# Patient Record
Sex: Male | Born: 1965 | Race: White | Hispanic: No | Marital: Married | Smoking: Former smoker
Health system: Southern US, Community
[De-identification: ages and names within clinical notes are randomized; demographics above are authoritative.]

## PROBLEM LIST (undated history)

## (undated) DIAGNOSIS — Z8489 Family history of other specified conditions: Secondary | ICD-10-CM

## (undated) DIAGNOSIS — I499 Cardiac arrhythmia, unspecified: Secondary | ICD-10-CM

## (undated) DIAGNOSIS — I480 Paroxysmal atrial fibrillation: Secondary | ICD-10-CM

## (undated) DIAGNOSIS — N411 Chronic prostatitis: Secondary | ICD-10-CM

## (undated) HISTORY — DX: Paroxysmal atrial fibrillation: I48.0

## (undated) HISTORY — PX: IRRIGATION AND DEBRIDEMENT SEBACEOUS CYST: SHX5255

## (undated) HISTORY — DX: Chronic prostatitis: N41.1

---

## 2014-04-28 DIAGNOSIS — I499 Cardiac arrhythmia, unspecified: Secondary | ICD-10-CM

## 2014-04-28 HISTORY — DX: Cardiac arrhythmia, unspecified: I49.9

## 2015-04-14 ENCOUNTER — Emergency Department (HOSPITAL_COMMUNITY): Payer: 59

## 2015-04-14 ENCOUNTER — Observation Stay (HOSPITAL_COMMUNITY)
Admission: EM | Admit: 2015-04-14 | Discharge: 2015-04-15 | Disposition: A | Discharge status: Home / Self Care | Payer: 59 | Attending: Cardiology | Admitting: Cardiology

## 2015-04-14 ENCOUNTER — Encounter (HOSPITAL_COMMUNITY): Payer: Self-pay | Admitting: Emergency Medicine

## 2015-04-14 DIAGNOSIS — H6690 Otitis media, unspecified, unspecified ear: Secondary | ICD-10-CM | POA: Insufficient documentation

## 2015-04-14 DIAGNOSIS — Z7982 Long term (current) use of aspirin: Secondary | ICD-10-CM | POA: Insufficient documentation

## 2015-04-14 DIAGNOSIS — I4891 Unspecified atrial fibrillation: Secondary | ICD-10-CM | POA: Diagnosis not present

## 2015-04-14 DIAGNOSIS — I480 Paroxysmal atrial fibrillation: Secondary | ICD-10-CM

## 2015-04-14 HISTORY — DX: Paroxysmal atrial fibrillation: I48.0

## 2015-04-14 LAB — BASIC METABOLIC PANEL
ANION GAP: 9 (ref 5–15)
BUN: 12 mg/dL (ref 6–20)
CHLORIDE: 105 mmol/L (ref 101–111)
CO2: 26 mmol/L (ref 22–32)
CREATININE: 1.05 mg/dL (ref 0.61–1.24)
Calcium: 9.3 mg/dL (ref 8.9–10.3)
GFR calc non Af Amer: >60 mL/min (ref >60)
Glucose, Bld: 103 mg/dL — ABNORMAL HIGH (ref 65–99)
Potassium: 3.9 mmol/L (ref 3.5–5.1)
Sodium: 140 mmol/L (ref 135–145)

## 2015-04-14 LAB — MAGNESIUM: MAGNESIUM: 1.9 mg/dL (ref 1.7–2.4)

## 2015-04-14 LAB — CBC
HCT: 42.4 % (ref 39.0–52.0)
HEMOGLOBIN: 14.5 g/dL (ref 13.0–17.0)
MCH: 29.4 pg (ref 26.0–34.0)
MCHC: 34.2 g/dL (ref 30.0–36.0)
MCV: 86 fL (ref 78.0–100.0)
Platelets: 243 10*3/uL (ref 150–400)
RBC: 4.93 MIL/uL (ref 4.22–5.81)
RDW: 13.1 % (ref 11.5–15.5)
WBC: 6.8 10*3/uL (ref 4.0–10.5)

## 2015-04-14 LAB — TSH: TSH: 3.03 u[IU]/mL (ref 0.350–4.500)

## 2015-04-14 LAB — I-STAT TROPONIN, ED: Troponin i, poc: 0 ng/mL (ref 0.00–0.08)

## 2015-04-14 LAB — TROPONIN I: Troponin I: <0.03 ng/mL (ref <0.031)

## 2015-04-14 MED ORDER — DILTIAZEM HCL 100 MG IV SOLR
5.0000 mg/h | INTRAVENOUS | Status: DC
  Administered 2015-04-14: 5 mg/h via INTRAVENOUS
  Administered 2015-04-15: 10 mg/h via INTRAVENOUS
  Filled 2015-04-14 (×2): qty 100

## 2015-04-14 MED ORDER — DILTIAZEM LOAD VIA INFUSION
20.0000 mg | Freq: Once | INTRAVENOUS | Status: AC
  Administered 2015-04-14: 20 mg via INTRAVENOUS
  Filled 2015-04-14: qty 20

## 2015-04-14 NOTE — ED Notes (Addendum)
Pt sts palpitations x 1 hour; pt denies hx of same; pt denies CP or SOB; pt currently on mucinex, claritin and flonase

## 2015-04-14 NOTE — ED Notes (Signed)
Patient transported to X-ray 

## 2015-04-14 NOTE — ED Provider Notes (Signed)
CSN: [id number]     Arrival date & time 04/14/15  1849 History   First MD Initiated Contact with Patient 04/14/15 1901     Chief Complaint  Patient presents with  . Palpitations     (Consider location/radiation/quality/duration/timing/severity/associated sxs/prior Treatment) HPI Patient presents with concern of palpitations, generalized discomfort. Onset was about one hour ago, the patient notes recent generalized illness. Patient has been taking Mucinex, Claritin, inhaled steroids for ongoing URI like illness, with bilateral ear fullness, worse on the right. Otherwise, no recent medication changes. Today, about one hour ago, the patient felt the sudden onset of irregular heart palpitations. Mild lightheadedness, but no syncope, true chest pain, nausea, vomiting. Patient denies any subsequent medical problems, including no cardiac disease, history of arrhythmia. Patient does have multiple family members with dysrhythmia.  History reviewed. No pertinent past medical history. History reviewed. No pertinent past surgical history. History reviewed. No pertinent family history. Social History  Substance Use Topics  . Smoking status: Never Smoker   . Smokeless tobacco: None  . Alcohol Use: No    Review of Systems  Constitutional:       Per HPI, otherwise negative  HENT:       Per HPI, otherwise negative  Respiratory:       Per HPI, otherwise negative  Cardiovascular:       Per HPI, otherwise negative  Gastrointestinal: Negative for vomiting.  Endocrine:       Negative aside from HPI  Genitourinary:       Neg aside from HPI   Musculoskeletal:       Per HPI, otherwise negative  Skin: Negative.   Neurological: Negative for syncope.      Allergies  Review of patient's allergies indicates no known allergies.  Home Medications   Prior to Admission medications   Medication Sig Start Date End Date Taking? Authorizing Provider  fluticasone (FLONASE) 50 MCG/ACT nasal spray  Place 1 spray into both nostrils daily as needed (cold symptoms).  04/13/15  Yes Historical Provider, MD  GARLIC PO Take 1 capsule by mouth daily.   Yes Historical Provider, MD  GuaiFENesin (MUCINEX PO) Take 1 tablet by mouth daily as needed (cold symptoms).   Yes Historical Provider, MD  ibuprofen (ADVIL,MOTRIN) 200 MG tablet Take 200 mg by mouth daily as needed for headache.   Yes Historical Provider, MD  loratadine (CLARITIN) 10 MG tablet Take 10 mg by mouth daily as needed (cold symptoms).   Yes Historical Provider, MD  Misc Natural Products (TURMERIC CURCUMIN) CAPS Take 1 capsule by mouth daily.   Yes Historical Provider, MD  Multiple Vitamin (MULTIVITAMIN WITH MINERALS) TABS tablet Take 1 tablet by mouth daily.   Yes Historical Provider, MD  OVER THE COUNTER MEDICATION Take 1 capsule by mouth daily. Prostrate support supplement with stinging nettle and cranberrry   Yes Historical Provider, MD   BP 117/83 mmHg  Pulse 79  Temp(Src) 97.8 F (36.6 C) (Oral)  Resp 15  SpO2 97% Physical Exam  Constitutional: He is oriented to person, place, and time. He appears well-developed. No distress.  HENT:  Head: Normocephalic and atraumatic.  Eyes: Conjunctivae and EOM are normal.  Cardiovascular: An irregularly irregular rhythm present. Tachycardia present.   Pulmonary/Chest: Effort normal. No stridor. No respiratory distress.  Abdominal: He exhibits no distension.  Musculoskeletal: He exhibits no edema.  Neurological: He is alert and oriented to person, place, and time.  Skin: Skin is warm and dry.  Psychiatric: He has a normal mood  and affect.  Nursing note and vitals reviewed.   ED Course  Procedures (including critical care time) Labs Review Labs Reviewed  BASIC METABOLIC PANEL - Abnormal; Notable for the following:    Glucose, Bld 103 (*)    All other components within normal limits  CBC  TROPONIN I  MAGNESIUM  TSH  I-STAT TROPOININ, ED    Imaging Review Dg Chest 2  View  04/14/2015  CLINICAL DATA:  Chest pain, arrhythmia. EXAM: CHEST  2 VIEW COMPARISON:  None. FINDINGS: Heart size is normal. Overall cardiomediastinal silhouette is normal in size and configuration. Lungs are clear. Lung volumes are normal. No pleural effusion. No pneumothorax seen. There is slight elevation the right hemidiaphragm. Osseous and soft tissue structures about the chest are otherwise unremarkable. IMPRESSION: Lungs are clear and there is no evidence of acute cardiopulmonary abnormality. Electronically Signed   By: Bary Richard M.D.   On: 04/14/2015 19:58   I have personally reviewed and evaluated these images and lab results as part of my medical decision-making.   EKG Interpretation   Date/Time:  Saturday April 14 2015 18:53:34 EST Ventricular Rate:  131 PR Interval:    QRS Duration: 80 QT Interval:  294 QTC Calculation: 434 R Axis:   90 Text Interpretation:  Atrial fibrillation with rapid ventricular response  Rightward axis Nonspecific ST abnormality Abnormal QRS-T angle, consider  primary T wave abnormality Abnormal ECG Atrial fibrillation with rapid  ventricular response Abnormal ekg Confirmed by Gerhard Munch  MD [phone])  on 04/14/2015 7:01:53 PM     On cardiac monitor the patient has a rate in the 120/130 range, irregular, abnormal Pulse oximetry 95% with room air abnormal  Initial evaluation the patient was started on a Cardizem drip.    8:29 PM HR~105   10:17 PM HR remains 80-110.  I discussed the patient's case with cardiology. Given the persistent tachycardia, new atrial relation, patient will be admitted.  MDM  Previously well male presents with new palpitations. Patient sent have atrial fibrillation with rapid ventricular response. Patient has no history of coronary, normal cardiac disease. No evidence for ongoing coronary ischemia, no evidence for concurrent systemic infection. Patient did have decreased heart rate on Cardizem, but had  persistent arrhythmia. Patient required admission for further evaluation and management.  CRITICAL CARE Performed by: Gerhard Munch Total critical care time: 35 minutes Critical care time was exclusive of separately billable procedures and treating other patients. Critical care was necessary to treat or prevent imminent or life-threatening deterioration. Critical care was time spent personally by me on the following activities: development of treatment plan with patient and/or surrogate as well as nursing, discussions with consultants, evaluation of patient's response to treatment, examination of patient, obtaining history from patient or surrogate, ordering and performing treatments and interventions, ordering and review of laboratory studies, ordering and review of radiographic studies, pulse oximetry and re-evaluation of patient's condition.   Gerhard Munch, MD 04/14/15 2218

## 2015-04-14 NOTE — H&P (Signed)
History & Physical    Patient ID: Nathan Atkins MRN: [id number], DOB/AGE: 1965-09-03   Admit date: 04/14/2015   Primary Physician: No primary care provider on file. Primary Cardiologist: None   Patient Profile    73M otherwise healthy with AF-RVR  Past Medical History    History reviewed. No pertinent past medical history.  History reviewed. No pertinent past surgical history.   Allergies  No Known Allergies  History of Present Illness    The patient is a 73M with no significant PMH who presents today with rapid irregular palpitations. He notes that he has been suffering from a cold for the past few months but was recently prescribed nasal Flonase, claritin, and Mucinex. Around 6pm today he described an irregular heartbeat and took his BP using a wrist monitor. It alerted him of an irregular heart rhythm so he presented to the ED. In the ED, telemetry revealed rapid AF. He was given a diltiazem bolus followed by infusion which controlled his HR.   Of note, the patient previously drank about 5 cups of coffee daily, though now he has cut back to 1 cup coffee and 3-4 cups of green or black tea. He does not use energy drinks or other stimulants. No history of HTN or OSA. No history of thyroid trouble. 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 Routinely gets about 5 hrs/night of sleep.   Home Medications    Prior to Admission medications   Medication Sig Start Date End Date Taking? Authorizing Provider  fluticasone (FLONASE) 50 MCG/ACT nasal spray Place 1 spray into both nostrils daily as needed (cold symptoms).  04/13/15  Yes Historical Provider, MD  GARLIC PO Take 1 capsule by mouth daily.   Yes Historical Provider, MD  GuaiFENesin (MUCINEX PO) Take 1 tablet by mouth daily as needed (cold symptoms).   Yes Historical Provider, MD  ibuprofen (ADVIL,MOTRIN) 200 MG tablet Take 200 mg by mouth daily as needed for headache.   Yes  Historical Provider, MD  loratadine (CLARITIN) 10 MG tablet Take 10 mg by mouth daily as needed (cold symptoms).   Yes Historical Provider, MD  Misc Natural Products (TURMERIC CURCUMIN) CAPS Take 1 capsule by mouth daily.   Yes Historical Provider, MD  Multiple Vitamin (MULTIVITAMIN WITH MINERALS) TABS tablet Take 1 tablet by mouth daily.   Yes Historical Provider, MD  OVER THE COUNTER MEDICATION Take 1 capsule by mouth daily. Prostrate support supplement with stinging nettle and cranberrry   Yes Historical Provider, MD    Family History    History reviewed. No pertinent family history.  Social History    Social History   Social History  . Marital Status: Married    Spouse Name: N/A  . Number of Children: N/A  . Years of Education: N/A   Occupational History  . Not on file.   Social History Main Topics  . Smoking status: Never Smoker   . Smokeless tobacco: Not on file  . Alcohol Use: No  . Drug Use: No  . Sexual Activity: Not on file   Other Topics Concern  . Not on file   Social History Narrative  . No narrative on file     Review of Systems    General:  No chills, fever, night sweats or weight changes.  Cardiovascular:  No chest pain, dyspnea on exertion, edema, orthopnea, palpitations, paroxysmal nocturnal dyspnea. HEENT: Ear fullness Dermatological: No rash, lesions/masses Respiratory: No cough,  dyspnea Urologic: No hematuria, dysuria Abdominal:   No nausea, vomiting, diarrhea, bright red blood per rectum, melena, or hematemesis Neurologic:  No visual changes, wkns, changes in mental status. All other systems reviewed and are otherwise negative except as noted above.  Physical Exam    Blood pressure 125/81, pulse 93, temperature 97.8 F (36.6 C), temperature source Oral, resp. rate 12, SpO2 96 %.  General: Pleasant, NAD Psych: Normal affect. Neuro: Alert and oriented X 3. Moves all extremities spontaneously. HEENT: conjunctival injection  Neck: Supple  without bruits or JVD. Lungs:  Resp regular and unlabored, CTA. Heart: IRIR no s3, s4, or murmurs. Abdomen: Soft, non-tender, non-distended, BS + x 4.  Extremities: No clubbing, cyanosis or edema. DP/PT/Radials 2+ and equal bilaterally.  Labs    Troponin [facility](Point of Care Test)  Recent Labs  04/14/15 1948  TROPIPOC 0.00    Recent Labs  04/14/15 1928  TROPONINI <0.03   Lab Results  Component Value Date   WBC 6.8 04/14/2015   HGB 14.5 04/14/2015   HCT 42.4 04/14/2015   MCV 86.0 04/14/2015   PLT 243 04/14/2015    Recent Labs Lab 04/14/15 1928  NA 140  K 3.9  CL 105  CO2 26  BUN 12  CREATININE 1.05  CALCIUM 9.3  GLUCOSE 103*    Radiology Studies    Dg Chest 2 View  04/14/2015  CLINICAL DATA:  Chest pain, arrhythmia. EXAM: CHEST  2 VIEW COMPARISON:  None. FINDINGS: Heart size is normal. Overall cardiomediastinal silhouette is normal in size and configuration. Lungs are clear. Lung volumes are normal. No pleural effusion. No pneumothorax seen. There is slight elevation the right hemidiaphragm. Osseous and soft tissue structures about the chest are otherwise unremarkable. IMPRESSION: Lungs are clear and there is no evidence of acute cardiopulmonary abnormality. Electronically Signed   By: Bary RichardStan  Maynard M.D.   On: 04/14/2015 19:58    ECG & Cardiac Imaging    131bpm, AF, no acute ST-TW changes. No prior EKG for comparison  Assessment & Plan    1. Rapid atrial fibrillation  The patient is a 48M with palpitations beginning around 6pm tonight, though he has been feeling generally unwell for the past 2 months with URI symptoms. EKG revealed AF-RVR. There is no evidence of heart failure on examination. His HR in the 90s-110s on diltiazem infusion currently. We had a long discussion about AF risk factors and management strategies, including rate versus rhythm control. He has no clear predisposing or lifestyle factors, though he has multiple stressors and also a high caffeine  intake. I counseled him to work on both of those. At this point we will continue the diltiazem overnight and assess whether he spontaneously converts to NSR. If not, we had some discussions about DCCV on Monday, though I would probably prefer that he undergo TEE given that he has felt so poorly for a long I cannot say definitively that his AF started at 6pm and is <48hrs. He will only need anticoagulation if a rhythm control strategy is preferred; otherwise, he is CHADS-VASc = 0  -continue diltiazem infusion, plan for oral conversion in AM -TTE in AM to evaluate for structural heart disease -TSH ordered -counseled on diet and lifestyle changes -no anticoagulation ordered but if rhythm control strategy then would consider starting OAC directly with rivaroxaban or apixaban tomorrow. He does not have indication for long-term therapy at this time but will need for 4 wks if rhythm control pursued.   FULL CODE  Signed, Sammuel Hines, MD MPH 04/14/2015, 10:28 PM

## 2015-04-15 ENCOUNTER — Encounter (HOSPITAL_COMMUNITY): Payer: Self-pay | Admitting: *Deleted

## 2015-04-15 ENCOUNTER — Observation Stay (HOSPITAL_BASED_OUTPATIENT_CLINIC_OR_DEPARTMENT_OTHER): Payer: 59

## 2015-04-15 DIAGNOSIS — H6692 Otitis media, unspecified, left ear: Secondary | ICD-10-CM

## 2015-04-15 DIAGNOSIS — H6690 Otitis media, unspecified, unspecified ear: Secondary | ICD-10-CM | POA: Diagnosis not present

## 2015-04-15 DIAGNOSIS — I4891 Unspecified atrial fibrillation: Principal | ICD-10-CM

## 2015-04-15 DIAGNOSIS — Z7189 Other specified counseling: Secondary | ICD-10-CM

## 2015-04-15 DIAGNOSIS — Z7982 Long term (current) use of aspirin: Secondary | ICD-10-CM | POA: Diagnosis not present

## 2015-04-15 LAB — BASIC METABOLIC PANEL
Anion gap: 10 (ref 5–15)
BUN: 10 mg/dL (ref 6–20)
CO2: 25 mmol/L (ref 22–32)
CREATININE: 0.99 mg/dL (ref 0.61–1.24)
Calcium: 8.8 mg/dL — ABNORMAL LOW (ref 8.9–10.3)
Chloride: 105 mmol/L (ref 101–111)
GFR calc Af Amer: >60 mL/min (ref >60)
GLUCOSE: 97 mg/dL (ref 65–99)
Potassium: 3.9 mmol/L (ref 3.5–5.1)
SODIUM: 140 mmol/L (ref 135–145)

## 2015-04-15 LAB — CBC
HCT: 41.2 % (ref 39.0–52.0)
Hemoglobin: 14.1 g/dL (ref 13.0–17.0)
MCH: 29.3 pg (ref 26.0–34.0)
MCHC: 34.2 g/dL (ref 30.0–36.0)
MCV: 85.7 fL (ref 78.0–100.0)
Platelets: 230 10*3/uL (ref 150–400)
RBC: 4.81 MIL/uL (ref 4.22–5.81)
RDW: 13.3 % (ref 11.5–15.5)
WBC: 6.7 10*3/uL (ref 4.0–10.5)

## 2015-04-15 MED ORDER — DILTIAZEM HCL 30 MG PO TABS: 30.0000 mg | ORAL_TABLET | Freq: Two times a day (BID) | ORAL | Status: DC

## 2015-04-15 MED ORDER — ONDANSETRON HCL 4 MG/2ML IJ SOLN: 4.0000 mg | Freq: Four times a day (QID) | INTRAMUSCULAR | Status: DC | PRN

## 2015-04-15 MED ORDER — ASPIRIN EC 325 MG PO TBEC
325.0000 mg | DELAYED_RELEASE_TABLET | Freq: Every day | ORAL | Status: DC
Start: 2015-04-15 — End: 2015-04-15
  Administered 2015-04-15: 325 mg via ORAL
  Filled 2015-04-15: qty 1

## 2015-04-15 MED ORDER — SULFAMETHOXAZOLE-TRIMETHOPRIM 800-160 MG PO TABS: 1.0000 | ORAL_TABLET | Freq: Two times a day (BID) | ORAL | Status: DC

## 2015-04-15 MED ORDER — SULFAMETHOXAZOLE-TRIMETHOPRIM 800-160 MG PO TABS
1.0000 | ORAL_TABLET | Freq: Two times a day (BID) | ORAL | Status: DC
  Administered 2015-04-15: 1 via ORAL
  Filled 2015-04-15 (×2): qty 1

## 2015-04-15 MED ORDER — FLUTICASONE PROPIONATE 50 MCG/ACT NA SUSP
1.0000 | Freq: Every day | NASAL | Status: DC | PRN
  Filled 2015-04-15: qty 16

## 2015-04-15 MED ORDER — LORATADINE 10 MG PO TABS: 10.0000 mg | ORAL_TABLET | Freq: Every day | ORAL | Status: DC | PRN

## 2015-04-15 MED ORDER — ACETAMINOPHEN 325 MG PO TABS: 650.0000 mg | ORAL_TABLET | ORAL | Status: DC | PRN

## 2015-04-15 MED ORDER — DILTIAZEM HCL 60 MG PO TABS
60.0000 mg | ORAL_TABLET | Freq: Four times a day (QID) | ORAL | Status: DC
  Administered 2015-04-15 (×2): 60 mg via ORAL
  Filled 2015-04-15 (×2): qty 1

## 2015-04-15 MED ORDER — ASPIRIN 325 MG PO TBEC: 325.0000 mg | DELAYED_RELEASE_TABLET | Freq: Every day | ORAL | Status: DC

## 2015-04-15 NOTE — Progress Notes (Signed)
SUBJECTIVE: Patient is feeling very well. Has had inner ear infection for some time (2 months) and took Claritin, Mucinex, and Flonase yesterday (normally takes no meds). He wonders if the combination of meds with ear infection led to atrial fibrillation. Mother does have tachyarrhythmias.  Has reduced coffee intake to 1-2 cups daily, primarily drinks black and green tea. Exercises daily.  His son participates on my daughter's robotics team.  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  They have 5 children.     Intake/Output Summary (Last 24 hours) at 04/15/15 1248 Last data filed at 04/15/15 1011  Gross per 24 hour  Intake 4353.54 ml  Output    275 ml  Net 4078.54 ml    Current Facility-Administered Medications  Medication Dose Route Frequency Provider Last Rate Last Dose  . acetaminophen (TYLENOL) tablet 650 mg  650 mg Oral Q4H PRN Sammuel HinesAmit N Vora, MD      . diltiazem (CARDIZEM) 100 mg in dextrose 5 % 100 mL (1 mg/mL) infusion  5-15 mg/hr Intravenous Continuous Gerhard Munchobert Lockwood, MD   Stopped at 04/15/15 0505  . diltiazem (CARDIZEM) tablet 60 mg  60 mg Oral 4 times per day Sammuel HinesAmit N Vora, MD   60 mg at 04/15/15 1240  . fluticasone (FLONASE) 50 MCG/ACT nasal spray 1 spray  1 spray Each Nare Daily PRN Sammuel HinesAmit N Vora, MD      . loratadine (CLARITIN) tablet 10 mg  10 mg Oral Daily PRN Sammuel HinesAmit N Vora, MD      . ondansetron (ZOFRAN) injection 4 mg  4 mg Intravenous Q6H PRN Sammuel HinesAmit N Vora, MD        Filed Vitals:   04/14/15 2245 04/14/15 2325 04/15/15 0224 04/15/15 0540  BP: 113/75 109/60 105/61 103/62  Pulse: 98 89 72 60  Temp:  97.9 F (36.6 C) 97.8 F (36.6 C) 97.9 F (36.6 C)  TempSrc:  Oral Oral Oral  Resp: 13 18 19 18   Height:  5\' 11"  (1.803 m)    Weight:  218 lb 14.7 oz (99.3 kg)  219 lb 9.3 oz (99.6 kg)  SpO2: 96% 97% 97% 96%    PHYSICAL EXAM General: NAD HEENT: Normal. Neck: No JVD, no thyromegaly.    Lungs: Clear to auscultation bilaterally with normal respiratory effort. CV: Nondisplaced PMI.  Regular rate and rhythm, normal S1/S2, no S3/S4, no murmur.  No pretibial edema.  No carotid bruit.  Normal pedal pulses.  Abdomen: Soft, nontender, no hepatosplenomegaly, no distention.  Neurologic: Alert and oriented x 3.  Psych: Normal affect. Musculoskeletal: Normal range of motion. No gross deformities. Extremities: No clubbing or cyanosis.   TELEMETRY: Reviewed telemetry pt in sinus rhythm.  LABS: Basic Metabolic Panel:  Recent Labs  40/98/1112/17/16 1928 04/15/15 0323  NA 140 140  K 3.9 3.9  CL 105 105  CO2 26 25  GLUCOSE 103* 97  BUN 12 10  CREATININE 1.05 0.99  CALCIUM 9.3 8.8*  MG 1.9  --    Liver Function Tests: No results for input(s): AST, ALT, ALKPHOS, BILITOT, PROT, ALBUMIN in the last 72 hours. No results for input(s): LIPASE, AMYLASE in the last 72 hours. CBC:  Recent Labs  04/14/15 1928 04/15/15 0323  WBC 6.8 6.7  HGB 14.5 14.1  HCT 42.4 41.2  MCV 86.0 85.7  PLT 243 230   Cardiac Enzymes:  Recent Labs  04/14/15 1928  TROPONINI <0.03  BNP: Invalid input(s): POCBNP D-Dimer: No results for input(s): DDIMER in the last 72 hours. Hemoglobin A1C: No results for input(s): HGBA1C in the last 72 hours. Fasting Lipid Panel: No results for input(s): CHOL, HDL, LDLCALC, TRIG, CHOLHDL, LDLDIRECT in the last 72 hours. Thyroid Function Tests:  Recent Labs  04/14/15 1942  TSH 3.030   Anemia Panel: No results for input(s): VITAMINB12, FOLATE, FERRITIN, TIBC, IRON, RETICCTPCT in the last 72 hours.  RADIOLOGY: Dg Chest 2 View  04/14/2015  CLINICAL DATA:  Chest pain, arrhythmia. EXAM: CHEST  2 VIEW COMPARISON:  None. FINDINGS: Heart size is normal. Overall cardiomediastinal silhouette is normal in size and configuration. Lungs are clear. Lung volumes are normal. No pleural effusion. No pneumothorax seen. There is slight elevation the right hemidiaphragm.  Osseous and soft tissue structures about the chest are otherwise unremarkable. IMPRESSION: Lungs are clear and there is no evidence of acute cardiopulmonary abnormality. Electronically Signed   By: Bary Richard M.D.   On: 04/14/2015 19:58      ASSESSMENT AND PLAN: 1. New-onset atrial fibrillation: May have been precipitated by multiple new meds taken yesterday in context of ear infection. Mother does have tachyarrhythmias. TSH normal. Back in sinus rhythm and on oral diltiazem. No evidence of heart failure and CHA2DS2VASC score is 0.  Plan: Will give ASA 325 mg daily x one month. Will start low-dose diltiazem 30 mg bid possibly only for one month until ear infection resolves. Will give prescription for Bactrim. Recommended he hold off on exercising vigorously for the next week until infection starts to subside and especially while arrhythmic threshold is low. I have ordered echocardiogram to evaluate for structural heart disease.  He can be discharged today.  I will have him follow up with me if he is willing to drive to our [place] office. Otherwise, will have him f/u with Dr. Jens Som in our [facility] office.   Prentice Docker, M.D., F.A.C.C.

## 2015-04-15 NOTE — Discharge Summary (Signed)
Physician Discharge Summary  Patient ID: Nathan Atkins MRN: [id number] DOB/AGE: 47/27/1967 47 y.o.  Admit date: 04/14/2015 Discharge date: 04/15/2015  Primary Discharge Diagnosis 1. New onset atrial fibrillation: 2. Internal ear infection   Primary Cardiologist: Dr.,Koneswaran  Significant Diagnostic Studies: Echocardiogram  Hospital Course:     [name] is a 49 year old male patient with no significant cardiac history who presented to the emergency room with rapid irregular palpitations. He isn't suffering from a cold for the past 2 months and was recently prescribed Flonase and Claritin along with Mucinex. On arrival to the emergency room he was found to be in rapid atrial fibrillation. He was given diltiazem bolus followed by infusion which controlled his heart rate. The patient also admitted to being under multiple stressors and also having high caffeine intake.  He was also found to have an ear infection, and had not been placed on antibiotics until this admission. He was started on Bactrim which she will continue to take for the next 2 weeks. The patient was continued on low-dose diltiazem 30 mg twice a day, he was continue on aspirin 325 mg 1 month. Echocardiogram was ordered but not resulted at time of discharge . He converted to normal sinus rhythm prior to being started on by mouth diltiazem.He was not placed on anticoagulation as his CHADS VASC Score is 1.   He is to follow-up in [place] at his request as this is close to his home.   Discharge Exam: Blood pressure 123/81, pulse 65, temperature 98.2 F (36.8 C), temperature source Oral, resp. rate 20, height  (1.803 m), weight 219 lb 9.3 oz (99.6 kg), SpO2 96 %.    Labs:   Lab Results  Component Value Date   WBC 6.7 04/15/2015   HGB 14.1 04/15/2015   HCT 41.2 04/15/2015   MCV 85.7 04/15/2015   PLT 230 04/15/2015     Recent Labs Lab 04/15/15 0323  NA 140  K 3.9  CL 105  CO2 25  BUN 10  CREATININE  0.99  CALCIUM 8.8*  GLUCOSE 97   Lab Results  Component Value Date   TROPONINI <0.03 04/14/2015   No results found for: CHOL No results found for: HDL No results found for: LDLCALC No results found for: TRIG No results found for: CHOLHDL No results found for: LDLDIRECT    Radiology: Dg Chest 2 View  04/14/2015  CLINICAL DATA:  Chest pain, arrhythmia. EXAM: CHEST  2 VIEW COMPARISON:  None. FINDINGS: Heart size is normal. Overall cardiomediastinal silhouette is normal in size and configuration. Lungs are clear. Lung volumes are normal. No pleural effusion. No pneumothorax seen. There is slight elevation the right hemidiaphragm. Osseous and soft tissue structures about the chest are otherwise unremarkable. IMPRESSION: Lungs are clear and there is no evidence of acute cardiopulmonary abnormality. Electronically Signed   By: Bary Richard M.D.   On: 04/14/2015 19:58    FOLLOW UP PLANS AND APPOINTMENTS     Discharge Instructions    Amb referral to AF[facility]    Complete by:  As directed      Diet - low sodium heart healthy    Complete by:  As directed      Increase activity slowly    Complete by:  As directed             Medication List    TAKE these medications        aspirin 325 MG EC tablet  Take 1 tablet (  325 mg total) by mouth daily.     diltiazem 30 MG tablet  Commonly known as:  CARDIZEM  Take 1 tablet (30 mg total) by mouth every 12 (twelve) hours.     fluticasone 50 MCG/ACT nasal spray  Commonly known as:  FLONASE  Place 1 spray into both nostrils daily as needed (cold symptoms).     GARLIC PO  Take 1 capsule by mouth daily.     ibuprofen 200 MG tablet  Commonly known as:  ADVIL,MOTRIN  Take 200 mg by mouth daily as needed for headache.     loratadine 10 MG tablet  Commonly known as:  CLARITIN  Take 10 mg by mouth daily as needed (cold symptoms).     MUCINEX PO  Take 1 tablet by mouth daily as needed (cold symptoms).     multivitamin with minerals  Tabs tablet  Take 1 tablet by mouth daily.     OVER THE COUNTER MEDICATION  Take 1 capsule by mouth daily. Prostrate support supplement with stinging nettle and cranberrry     sulfamethoxazole-trimethoprim 800-160 MG tablet  Commonly known as:  BACTRIM DS,SEPTRA DS  Take 1 tablet by mouth every 12 (twelve) hours.     Turmeric Curcumin Caps  Take 1 capsule by mouth daily.       Follow-up Information    Follow up with Olga MillersBrian Crenshaw, MD.   Specialty:  Cardiology   Why:  Our office will call you for appointment   Contact information:   [address] [place] [id number] [phone]         Time spent with patient to include physician time: 30 minutes Signed: Bettey MareKathryn M. Nena Hampe NP AACC  04/15/2015, 3:11 PM Co-Sign MD

## 2015-04-15 NOTE — Discharge Instructions (Signed)
Aspirin and Your Heart  Aspirin is a medicine that affects the way blood clots. Aspirin can be used to help reduce the risk of blood clots, heart attacks, and other heart-related problems.  SHOULD I TAKE ASPIRIN? Your health care provider will help you determine whether it is safe and beneficial for you to take aspirin daily. Taking aspirin daily may be beneficial if you:  Have had a heart attack or chest pain.  Have undergone open heart surgery such as coronary artery bypass surgery (CABG).  Have had coronary angioplasty.  Have experienced a stroke or transient ischemic attack (TIA).  Have peripheral vascular disease (PVD).  Have chronic heart rhythm problems such as atrial fibrillation. ARE THERE ANY RISKS OF TAKING ASPIRIN DAILY? Daily use of aspirin can increase your risk of side effects. Some of these include:  Bleeding. Bleeding problems can be minor or serious. An example of a minor problem is a cut that does not stop bleeding. An example of a more serious problem is stomach bleeding or bleeding into the brain. Your risk of bleeding is increased if you are also taking non-steroidal anti-inflammatory medicine (NSAIDs).  Increased bruising.  Upset stomach.  An allergic reaction. People who have nasal polyps have an increased risk of developing an aspirin allergy. WHAT ARE SOME GUIDELINES I SHOULD FOLLOW WHEN TAKING ASPIRIN?   Take aspirin only as directed by your health care provider. Make sure you understand how much you should take and what form you should take. The two forms of aspirin are:  Non-enteric-coated. This type of aspirin does not have a coating and is absorbed quickly. Non-enteric-coated aspirin is usually recommended for people with chest pain. This type of aspirin also comes in a chewable form.  Enteric-coated. This type of aspirin has a special coating that releases the medicine very slowly. Enteric-coated aspirin causes less stomach upset than non-enteric-coated  aspirin. This type of aspirin should not be chewed or crushed.  Drink alcohol in moderation. Drinking alcohol increases your risk of bleeding. WHEN SHOULD I SEEK MEDICAL CARE?   You have unusual bleeding or bruising.  You have stomach pain.  You have an allergic reaction. Symptoms of an allergic reaction include:  Hives.  Itchy skin.  Swelling of the lips, tongue, or face.  You have ringing in your ears. WHEN SHOULD I SEEK IMMEDIATE MEDICAL CARE?   Your bowel movements are bloody, dark red, or black in color.  You vomit or cough up blood.  You have blood in your urine.  You cough, wheeze, or feel short of breath. If you have any of the following symptoms, this is an emergency. Do not wait to see if the pain will go away. Get medical help at once. Call your local emergency services (911 in the [place] Do not drive yourself to the hospital.  You have severe chest pain, especially if the pain is crushing or pressure-like and spreads to the arms, back, neck, or jaw.  You have stroke-like symptoms, such as:   Loss of vision.   Difficulty talking.   Numbness or weakness on one side of your body.   Numbness or weakness in your arm or leg.   Not thinking clearly or feeling confused.    This information is not intended to replace advice given to you by your health care provider. Make sure you discuss any questions you have with your health care provider.   Document Released: 03/27/2008 Document Revised: 05/05/2014 Document Reviewed: 07/20/2013 Elsevier Interactive Patient Education 2016 Elsevier  Inc.  Atrial Fibrillation Atrial fibrillation is a type of heartbeat that is irregular or fast (rapid). If you have this condition, your heart keeps quivering in a weird (chaotic) way. This condition can make it so your heart cannot pump blood normally. Having this condition gives a person more risk for stroke, heart failure, and other heart problems. There are different types  of atrial fibrillation. Talk with your doctor to learn about the type that you have. HOME CARE  Take over-the-counter and prescription medicines only as told by your doctor.  If your doctor prescribed a blood-thinning medicine, take it exactly as told. Taking too much of it can cause bleeding. If you do not take enough of it, you will not have the protection that you need against stroke and other problems.  Do not use any tobacco products. These include cigarettes, chewing tobacco, and e-cigarettes. If you need help quitting, ask your doctor.  If you have apnea (obstructive sleep apnea), manage it as told by your doctor.  Do not drink alcohol.  Do not drink beverages that have caffeine. These include coffee, soda, and tea.  Maintain a healthy weight. Do not use diet pills unless your doctor says they are safe for you. Diet pills may make heart problems worse.  Follow diet instructions as told by your doctor.  Exercise regularly as told by your doctor.  Keep all follow-up visits as told by your doctor. This is important. GET HELP IF:  You notice a change in the speed, rhythm, or strength of your heartbeat.  You are taking a blood-thinning medicine and you notice more bruising.  You get tired more easily when you move or exercise. GET HELP RIGHT AWAY IF:  You have pain in your chest or your belly (abdomen).  You have sweating or weakness.  You feel sick to your stomach (nauseous).  You notice blood in your throw up (vomit), poop (stool), or pee (urine).  You are short of breath.  You suddenly have swollen feet and ankles.  You feel dizzy.  Your suddenly get weak or numb in your face, arms, or legs, especially if it happens on one side of your body.  You have trouble talking, trouble understanding, or both.  Your face or your eyelid droops on one side. These symptoms may be an emergency. Do not wait to see if the symptoms will go away. Get medical help right away. Call  your local emergency services (911 in the [place] Do not drive yourself to the hospital.   This information is not intended to replace advice given to you by your health care provider. Make sure you discuss any questions you have with your health care provider.   Document Released: 01/22/2008 Document Revised: 01/03/2015 Document Reviewed: 08/09/2014 Elsevier Interactive Patient Education 2016 Elsevier Inc.  Atrial Fibrillation Atrial fibrillation is a type of irregular or rapid heartbeat (arrhythmia). In atrial fibrillation, the heart quivers continuously in a chaotic pattern. This occurs when parts of the heart receive disorganized signals that make the heart unable to pump blood normally. This can increase the risk for stroke, heart failure, and other heart-related conditions. There are different types of atrial fibrillation, including:  Paroxysmal atrial fibrillation. This type starts suddenly, and it usually stops on its own shortly after it starts.  Persistent atrial fibrillation. This type often lasts longer than a week. It may stop on its own or with treatment.  Long-lasting persistent atrial fibrillation. This type lasts longer than 12 months.  Permanent atrial  fibrillation. This type does not go away. Talk with your health care provider to learn about the type of atrial fibrillation that you have. CAUSES This condition is caused by some heart-related conditions or procedures, including:  A heart attack.  Coronary artery disease.  Heart failure.  Heart valve conditions.  High blood pressure.  Inflammation of the sac that surrounds the heart (pericarditis).  Heart surgery.  Certain heart rhythm disorders, such as Wolf-Parkinson-White syndrome. Other causes include:  Pneumonia.  Obstructive sleep apnea.  Blockage of an artery in the lungs (pulmonary embolism, or PE).  Lung cancer.  Chronic lung disease.  Thyroid problems, especially if the thyroid is overactive  (hyperthyroidism).  Caffeine.  Excessive alcohol use or illegal drug use.  Use of some medicines, including certain decongestants and diet pills. Sometimes, the cause cannot be found. RISK FACTORS This condition is more likely to develop in:  People who are older in age.  People who smoke.  People who have diabetes mellitus.  People who are overweight (obese).  Athletes who exercise vigorously. SYMPTOMS Symptoms of this condition include:  A feeling that your heart is beating rapidly or irregularly.  A feeling of discomfort or pain in your chest.  Shortness of breath.  Sudden light-headedness or weakness.  Getting tired easily during exercise. In some cases, there are no symptoms. DIAGNOSIS Your health care provider may be able to detect atrial fibrillation when taking your pulse. If detected, this condition may be diagnosed with:  An electrocardiogram (ECG).  A Holter monitor test that records your heartbeat patterns over a 24-hour period.  Transthoracic echocardiogram (TTE) to evaluate how blood flows through your heart.  Transesophageal echocardiogram (TEE) to view more detailed images of your heart.  A stress test.  Imaging tests, such as a CT scan or chest X-ray.  Blood tests. TREATMENT The main goals of treatment are to prevent blood clots from forming and to keep your heart beating at a normal rate and rhythm. The type of treatment that you receive depends on many factors, such as your underlying medical conditions and how you feel when you are experiencing atrial fibrillation. This condition may be treated with:  Medicine to slow down the heart rate, bring the heart's rhythm back to normal, or prevent clots from forming.  Electrical cardioversion. This is a procedure that resets your heart's rhythm by delivering a controlled, low-energy shock to the heart through your skin.  Different types of ablation, such as catheter ablation, catheter ablation with  pacemaker, or surgical ablation. These procedures destroy the heart tissues that send abnormal signals. When the pacemaker is used, it is placed under your skin to help your heart beat in a regular rhythm. HOME CARE INSTRUCTIONS  Take over-the counter and prescription medicines only as told by your health care provider.  If your health care provider prescribed a blood-thinning medicine (anticoagulant), take it exactly as told. Taking too much blood-thinning medicine can cause bleeding. If you do not take enough blood-thinning medicine, you will not have the protection that you need against stroke and other problems.  Do not use tobacco products, including cigarettes, chewing tobacco, and e-cigarettes. If you need help quitting, ask your health care provider.  If you have obstructive sleep apnea, manage your condition as told by your health care provider.  Do not drink alcohol.  Do not drink beverages that contain caffeine, such as coffee, soda, and tea.  Maintain a healthy weight. Do not use diet pills unless your health care provider  approves. Diet pills may make heart problems worse.  Follow diet instructions as told by your health care provider.  Exercise regularly as told by your health care provider.  Keep all follow-up visits as told by your health care provider. This is important. PREVENTION  Avoid drinking beverages that contain caffeine or alcohol.  Avoid certain medicines, especially medicines that are used for breathing problems.  Avoid certain herbs and herbal medicines, such as those that contain ephedra or ginseng.  Do not use illegal drugs, such as cocaine and amphetamines.  Do not smoke.  Manage your high blood pressure. SEEK MEDICAL CARE IF:  You notice a change in the rate, rhythm, or strength of your heartbeat.  You are taking an anticoagulant and you notice increased bruising.  You tire more easily when you exercise or exert yourself. SEEK IMMEDIATE  MEDICAL CARE IF:  You have chest pain, abdominal pain, sweating, or weakness.  You feel nauseous.  You notice blood in your vomit, bowel movement, or urine.  You have shortness of breath.  You suddenly have swollen feet and ankles.  You feel dizzy.  You have sudden weakness or numbness of the face, arm, or leg, especially on one side of the body.  You have trouble speaking, trouble understanding, or both (aphasia).  Your face or your eyelid droops on one side. These symptoms may represent a serious problem that is an emergency. Do not wait to see if the symptoms will go away. Get medical help right away. Call your local emergency services (911 in the [place] Do not drive yourself to the hospital.   This information is not intended to replace advice given to you by your health care provider. Make sure you discuss any questions you have with your health care provider.   Document Released: 04/14/2005 Document Revised: 01/03/2015 Document Reviewed: 08/09/2014 Elsevier Interactive Patient Education [employer]

## 2015-04-15 NOTE — Plan of Care (Signed)
Problem: Phase I Progression Outcomes Goal: Voiding-avoid urinary catheter unless indicated Outcome: Completed/Met Date Met:  04/15/15 Using urinal

## 2015-04-15 NOTE — Progress Notes (Signed)
Patient progressed well on Cardizem gtt and converted to Sinus approximately 0025 with HR 70's to 90's. Dr Ace GinsVora  texted  then called. Order received to administer Cardizem 60 mg po & keep gtt on for 30 minutes @ then current rate of 10 cc/hr. Cardizem was decreased to 5 mg/hr @ 0435 for another 30 minutes then to d/c. No deviation noted as this Clinical research associatewriter continues to monitor patient.

## 2015-04-15 NOTE — Progress Notes (Signed)
  Echocardiogram 2D Echocardiogram has been performed.  Nathan Atkins, Nathan Atkins 04/15/2015, 2:35 PM

## 2015-04-15 NOTE — Progress Notes (Signed)
Pt discharge education and instructions completed with pt; pt voices understanding and denies any questions. Pt IV and telemetry removed; pt discharge home with family to transport him home. Pt handed his prescription for aspirin and to pick up electronically sent prescriptions from preferred pharmacy on file. Pt transported off unit via wheelchair with belongings to the side. Arabella MerlesP. Amo Deetra Booton RN.

## 2015-05-01 ENCOUNTER — Encounter: Payer: Self-pay | Admitting: Physician Assistant

## 2015-05-01 ENCOUNTER — Ambulatory Visit (INDEPENDENT_AMBULATORY_CARE_PROVIDER_SITE_OTHER): Payer: 59 | Admitting: Physician Assistant

## 2015-05-01 VITALS — BP 128/76 | HR 70 | Ht 71.0 in | Wt 220.7 lb

## 2015-05-01 DIAGNOSIS — I4891 Unspecified atrial fibrillation: Secondary | ICD-10-CM

## 2015-05-01 DIAGNOSIS — R002 Palpitations: Secondary | ICD-10-CM | POA: Diagnosis not present

## 2015-05-01 DIAGNOSIS — I480 Paroxysmal atrial fibrillation: Secondary | ICD-10-CM

## 2015-05-01 MED ORDER — DILTIAZEM HCL 30 MG PO TABS: ORAL_TABLET | ORAL | Status: DC

## 2015-05-01 NOTE — Progress Notes (Signed)
Cardiology Office Note   Date:  05/01/2015   ID:  Nathan Atkins, DOB 09/23/1965, MRN [id number]  PCP:  No primary care provider on file.  Cardiologist:   Seen in hospital by Dr Koneswaran>> Dr. Verne Carrowandolph Shallen Luedke, [place]   Chief Complaint  Patient presents with  . Hospitalization Follow-up  . Atrial Fibrillation    History of Present Illness: Nathan Atkins is a 50 y.o. male with a history of recent admission for PAF, not anticoagulated as CHADS2VASC=1. He converted spontaneously to sinus rhythm.  Nathan Atkins presents for  Post hospital follow-up.   Nathan Atkins was symptomatic with the atrial fibrillation. He has not had those symptoms since he was discharged. He took the Cardizem after discharge as requested and discontinued it after was completed. He was a little tired and wonders if that was because of the Cardizem, but that is resolved. He has increased his activity somewhat and went for a long hike the other day without any difficulty. He has not had palpitations, shortness of breath, dyspnea on exertion or chest pain. He feels he is in his usual state of health, and feels well. He wonders if he needs to continue the aspirin.   Past Medical History  Diagnosis Date  . Paroxysmal atrial fibrillation with rapid ventricular response (HCC) 04/14/2015    Spontaneously converted to sinus rhythm after started on diltiazem  . Chronic prostatitis     Past Surgical History  Procedure Laterality Date  . Irrigation and debridement sebaceous cyst      Current Outpatient Prescriptions  Medication Sig Dispense Refill  . aspirin EC 325 MG EC tablet Take 1 tablet (325 mg total) by mouth daily. (Patient taking differently: Take 162 mg by mouth 2 (two) times daily. ) 30 tablet 0  . GARLIC PO Take 1 capsule by mouth daily.    . Multiple Vitamin (MULTIVITAMIN WITH MINERALS) TABS tablet Take 1 tablet by mouth daily.    Marland Kitchen. OVER THE COUNTER MEDICATION Take 1 capsule by mouth  daily. Prostrate support supplement with stinging nettle and cranberrry     No current facility-administered medications for this visit.    Allergies:   Review of patient's allergies indicates no known allergies.    Social History:  The patient  reports that he has never smoked. He does not have any smokeless tobacco history on file. He reports that he does not drink alcohol or use illicit drugs.   Family History:  The patient's family history includes Arrhythmia in his mother.    ROS:  Please see the history of present illness. All other systems are reviewed and negative.    PHYSICAL EXAM: VS:  BP 128/76 mmHg  Pulse 70  Ht 5\' 11"  (1.803 m)  Wt 220 lb 11.2 oz (100.109 kg)  BMI 30.80 kg/m2 , BMI Body mass index is 30.8 kg/(m^2). GEN: Well nourished, well developed, male in no acute distress HEENT: normal for age  Neck: no JVD, no carotid bruit, no masses Cardiac: RRR; no murmur, no rubs, or gallops Respiratory:  clear to auscultation bilaterally, normal work of breathing GI: soft, nontender, nondistended, + BS MS: no deformity or atrophy; no edema; distal pulses are 2+ in all 4 extremities  Skin: warm and dry, no rash Neuro:  Strength and sensation are intact Psych: euthymic mood, full affect   EKG:  EKG is ordered today. The ekg ordered today demonstrates  Sinus rhythm, rate 70 with normal intervals and no acute changes.   Echocardiogram :  04/15/2015 Conclusions  - Left ventricle: The cavity size was normal. Wall thickness was normal. Systolic function was vigorous. The estimated ejection fraction was in the range of 65% to 70%. Wall motion was normal; there were no regional wall motion abnormalities. Left ventricular diastolic function parameters were normal.  Recent Labs: 04/14/2015: Magnesium 1.9; TSH 3.030 04/15/2015: BUN 10; Creatinine, Ser 0.99; Hemoglobin 14.1; Platelets 230; Potassium 3.9; Sodium 140    Lipid Panel No results found for: CHOL, TRIG,  HDL, CHOLHDL, VLDL, LDLCALC, LDLDIRECT   Wt Readings from Last 3 Encounters:  05/01/15 220 lb 11.2 oz (100.109 kg)  04/15/15 219 lb 9.3 oz (99.6 kg)     Other studies Reviewed: Additional studies/ records that were reviewed today include:  Hospital records , echocardiogram.  ASSESSMENT AND PLAN:  1.   PAF : he has not had any over-the-counter medications that would contribute to this. He is monitoring his caffeine intake. He is asymptomatic and does not believe he has had any atrial fibrillation since leaving the hospital. He converted on Cardizem.   For now, we will continue the Cardizem as when necessary only. There was no atrial enlargement and his EF was normal on echo. He can continue the aspirin but decrease it to baby aspirin only. He is encouraged to increase his activity an contact us for symptoms. Otherwise we will see him on a yearly basis.   2.  Chronic prostatitis and other issues: he is encouraged to find a primary care physician and states he will do this.  Current medicines are reviewed at length with the patient today.  The patient has concerns regarding medicines.  The following changes have been made:   Cardizem when necessary only,  Decrease aspirin to 81 mg daily  Labs/ tests ordered today include:   Orders Placed This Encounter  Procedures  . EKG 12-Lead     Disposition:   FU with  Dr. Duke Salvia in a year  Signed, Leanna Battles  05/01/2015 4:12 PM    [facility] [address] [place], [place], [place]  [address] Phone: [phone]; Fax: [phone]

## 2015-05-01 NOTE — Patient Instructions (Signed)
Your physician has recommended you make the following change in your medication: TAKE CARDIZEM 30 MG FOUR TIMES A DAY AS NEEDED FOR PALPITATIONS OR IRREGULAR HEART BEAT.  CALL OUR OFFICE IF YOU TAKE THE ABOVE MEDICATION BUT YOU DO NOT NEED TO GO TO THE ER UNLESS THIS DOES NOT HELP YOUR SYMPTOMS.  Your physician recommends that you schedule a follow-up appointment in: ONE YEAR WITH DR. [place]

## 2015-05-03 ENCOUNTER — Telehealth: Payer: Self-pay | Admitting: *Deleted

## 2015-05-03 MED ORDER — ASPIRIN 81 MG PO TBEC: 81.0000 mg | DELAYED_RELEASE_TABLET | Freq: Every day | ORAL | Status: DC

## 2015-05-03 NOTE — Telephone Encounter (Signed)
-----   Message from Darrol Jumphonda G Barrett, PA-C sent at 05/01/2015  4:21 PM EST -----  Please call him and let him know that he can decrease his aspirin to 81 mg daily.   Thanks, Bjorn Loserhonda

## 2015-05-03 NOTE — Telephone Encounter (Signed)
Called to verify ASA dose.  Per wife he is taking 81 mg daily.  Changed in Epic.

## 2016-12-03 ENCOUNTER — Ambulatory Visit: Payer: Self-pay | Admitting: Surgery

## 2016-12-03 NOTE — Progress Notes (Signed)
Please place orders in EPIC as patient is being scheduled for a pre-op appointment and his surgery is 12/05/2016! Thank you!

## 2016-12-04 ENCOUNTER — Encounter (HOSPITAL_COMMUNITY): Payer: Self-pay | Admitting: *Deleted

## 2016-12-04 NOTE — H&P (Signed)
Nathan HopperDwight Atkins 10/31/2016 11:37 AM Location: [facility] Patient #: [id number] DOB: 05/27/1965 Married / Language: English / Race: White Male   History of Present Illness Nathan Atkins(Nathan Renbarger B. Daphine DeutscherMartin MD; 10/31/2016 11:56 AM) The patient is a 51 year old male who presents with an inguinal hernia. The hernia(s) is/are located on the right side. This was noted by Dr. Earlene Plateravis when he presented for a follow-up for prostatitis. Patient has been asymptomatic from this and did not even know he had. On exam he has a fairly prominent rodding hernia that easily reduces. I told him what to look for in terms of incarceration. We discussed repair with mesh. I would do him as an outpatient and placed mesh after reducing the what is probably an indirect inguinal hernia amputating the sac.  We gave him the [employer] book on hernia surgery  He's had a history of atrial fibrillation with rapid ventricular response treated to the ER once. He is currently asymptomatic. He generally like to take any medicines. Otherwise healthy.   Past Surgical History (Nathan Atkins, CMA; 10/31/2016 11:38 AM) Ventral / Umbilical Hernia Surgery  Bilateral.  Diagnostic Studies History (Nathan Atkins, CMA; 10/31/2016 11:38 AM) Colonoscopy  never  Allergies (Nathan Atkins, CMA; 10/31/2016 11:39 AM) No Known Drug Allergies 10/31/2016 Allergies Reconciled   Medication History (Nathan Atkins, CMA; 10/31/2016 11:39 AM) Multivitamin Adults (Oral) Active.  Social History (Nathan Atkins, CMA; 10/31/2016 11:38 AM) Caffeine use  Coffee, Tea. No alcohol use  No drug use  Tobacco use  Former smoker.  Family History (Nathan Atkins, CMA; 10/31/2016 11:38 AM) Anesthetic complications  Brother, Mother. Hypertension  Mother. Migraine Headache  Sister. Prostate Cancer  Father. Respiratory Condition  Father. Thyroid problems  Mother.  Other Problems (Nathan Atkins, CMA; 10/31/2016 11:38 AM) Atrial Fibrillation  Umbilical  Hernia Repair     Review of Systems (Nathan Atkins CMA; 10/31/2016 11:38 AM) General Not Present- Appetite Loss, Chills, Fatigue, Fever, Night Sweats, Weight Gain and Weight Loss. Skin Not Present- Change in Wart/Mole, Dryness, Hives, Jaundice, New Lesions, Non-Healing Wounds, Rash and Ulcer. HEENT Present- Wears glasses/contact lenses. Not Present- Earache, Hearing Loss, Hoarseness, Nose Bleed, Oral Ulcers, Ringing in the Ears, Seasonal Allergies, Sinus Pain, Sore Throat, Visual Disturbances and Yellow Eyes. Respiratory Not Present- Bloody sputum, Chronic Cough, Difficulty Breathing, Snoring and Wheezing. Breast Not Present- Breast Mass, Breast Pain, Nipple Discharge and Skin Changes. Cardiovascular Not Present- Chest Pain, Difficulty Breathing Lying Down, Leg Cramps, Palpitations, Rapid Heart Rate, Shortness of Breath and Swelling of Extremities. Gastrointestinal Not Present- Abdominal Pain, Bloating, Bloody Stool, Change in Bowel Habits, Chronic diarrhea, Constipation, Difficulty Swallowing, Excessive gas, Gets full quickly at meals, Hemorrhoids, Indigestion, Nausea, Rectal Pain and Vomiting. Male Genitourinary Not Present- Blood in Urine, Change in Urinary Stream, Frequency, Impotence, Nocturia, Painful Urination, Urgency and Urine Leakage. Musculoskeletal Present- Muscle Pain. Not Present- Back Pain, Joint Pain, Joint Stiffness, Muscle Weakness and Swelling of Extremities. Neurological Not Present- Decreased Memory, Fainting, Headaches, Numbness, Seizures, Tingling, Tremor, Trouble walking and Weakness. Psychiatric Not Present- Anxiety, Bipolar, Change in Sleep Pattern, Depression, Fearful and Frequent crying. Endocrine Not Present- Cold Intolerance, Excessive Hunger, Hair Changes, Heat Intolerance, Hot flashes and New Diabetes. Hematology Not Present- Blood Thinners, Easy Bruising, Excessive bleeding, Gland problems, HIV and Persistent Infections.  Vitals (Nathan Atkins CMA; 10/31/2016 11:40  AM) 10/31/2016 11:39 AM Weight: 200.6 lb Height: 71in Body Surface Area: 2.11 m Body Mass Index: 27.98 kg/m  Temp.: 97.57F  Pulse: 113 (Regular)  BP: 128/72 (Sitting, Left Arm,  Standard)       Physical Exam Nathan Atkins B. Daphine Deutscher MD; 10/31/2016 11:57 AM) General Note: WDWNWM HEENT unremarkable Neck no bruits Chest clear Heart SR Abdomen with easily visible right inguinal hernia Ext FROM     Assessment & Plan Nathan Atkins B. Daphine Deutscher MD; 10/31/2016 11:58 AM) RIGHT INGUINAL HERNIA (K40.90) Impression: When he decides to have surgery will schedule at [facility], general anesthesia--right inguinal hernia repair with mesh. He will call and we can schedule  Signed electronically by Valarie Merino, MD (10/31/2016 11:59 AM)

## 2016-12-05 ENCOUNTER — Ambulatory Visit (HOSPITAL_COMMUNITY): Payer: 59 | Admitting: Anesthesiology

## 2016-12-05 ENCOUNTER — Encounter (HOSPITAL_COMMUNITY)
Admission: RE | Disposition: A | Discharge status: Home / Self Care | Payer: Self-pay | Source: Ambulatory Visit | Attending: Surgery

## 2016-12-05 ENCOUNTER — Ambulatory Visit (HOSPITAL_COMMUNITY)
Admission: RE | Admit: 2016-12-05 | Discharge: 2016-12-05 | Disposition: A | Discharge status: Home / Self Care | Payer: 59 | Source: Ambulatory Visit | Attending: Surgery | Admitting: Surgery

## 2016-12-05 ENCOUNTER — Encounter (HOSPITAL_COMMUNITY): Payer: Self-pay | Admitting: *Deleted

## 2016-12-05 DIAGNOSIS — K4090 Unilateral inguinal hernia, without obstruction or gangrene, not specified as recurrent: Secondary | ICD-10-CM | POA: Diagnosis present

## 2016-12-05 DIAGNOSIS — Z87891 Personal history of nicotine dependence: Secondary | ICD-10-CM | POA: Diagnosis not present

## 2016-12-05 HISTORY — PX: INSERTION OF MESH: SHX5868

## 2016-12-05 HISTORY — DX: Family history of other specified conditions: Z84.89

## 2016-12-05 HISTORY — DX: Cardiac arrhythmia, unspecified: I49.9

## 2016-12-05 HISTORY — PX: INGUINAL HERNIA REPAIR: SHX194

## 2016-12-05 LAB — BASIC METABOLIC PANEL
ANION GAP: 9 (ref 5–15)
BUN: 12 mg/dL (ref 6–20)
CHLORIDE: 106 mmol/L (ref 101–111)
CO2: 27 mmol/L (ref 22–32)
Calcium: 9.4 mg/dL (ref 8.9–10.3)
Creatinine, Ser: 1.12 mg/dL (ref 0.61–1.24)
GFR calc Af Amer: >60 mL/min (ref >60)
GLUCOSE: 112 mg/dL — AB (ref 65–99)
POTASSIUM: 3.7 mmol/L (ref 3.5–5.1)
Sodium: 142 mmol/L (ref 135–145)

## 2016-12-05 LAB — CBC
HEMATOCRIT: 41 % (ref 39.0–52.0)
HEMOGLOBIN: 14.2 g/dL (ref 13.0–17.0)
MCH: 28.9 pg (ref 26.0–34.0)
MCHC: 34.6 g/dL (ref 30.0–36.0)
MCV: 83.5 fL (ref 78.0–100.0)
Platelets: 250 10*3/uL (ref 150–400)
RBC: 4.91 MIL/uL (ref 4.22–5.81)
RDW: 13.3 % (ref 11.5–15.5)
WBC: 5.5 10*3/uL (ref 4.0–10.5)

## 2016-12-05 SURGERY — REPAIR, HERNIA, INGUINAL, ADULT
Anesthesia: Regional | Site: Inguinal | Laterality: Right

## 2016-12-05 MED ORDER — EPHEDRINE 5 MG/ML INJ
INTRAVENOUS | Status: AC
  Filled 2016-12-05: qty 10

## 2016-12-05 MED ORDER — BUPIVACAINE LIPOSOME 1.3 % IJ SUSP
20.0000 mL | Freq: Once | INTRAMUSCULAR | Status: AC
  Administered 2016-12-05: 20 mL
  Filled 2016-12-05: qty 20

## 2016-12-05 MED ORDER — CEFAZOLIN SODIUM-DEXTROSE 2-4 GM/100ML-% IV SOLN
2.0000 g | INTRAVENOUS | Status: AC
  Administered 2016-12-05: 2 g via INTRAVENOUS
  Filled 2016-12-05: qty 100

## 2016-12-05 MED ORDER — CHLORHEXIDINE GLUCONATE CLOTH 2 % EX PADS: 6.0000 | MEDICATED_PAD | Freq: Once | CUTANEOUS | Status: DC

## 2016-12-05 MED ORDER — HYDROCODONE-ACETAMINOPHEN 5-325 MG PO TABS: 1.0000 | ORAL_TABLET | ORAL | 0 refills | Status: AC | PRN

## 2016-12-05 MED ORDER — ONDANSETRON HCL 4 MG/2ML IJ SOLN
INTRAMUSCULAR | Status: AC
  Filled 2016-12-05: qty 2

## 2016-12-05 MED ORDER — GABAPENTIN 300 MG PO CAPS
300.0000 mg | ORAL_CAPSULE | ORAL | Status: AC
  Administered 2016-12-05: 300 mg via ORAL
  Filled 2016-12-05: qty 1

## 2016-12-05 MED ORDER — SODIUM CHLORIDE 0.9 % IV SOLN: 250.0000 mL | INTRAVENOUS | Status: DC | PRN

## 2016-12-05 MED ORDER — MIDAZOLAM HCL 5 MG/5ML IJ SOLN
INTRAMUSCULAR | Status: DC | PRN
  Administered 2016-12-05: 2 mg via INTRAVENOUS

## 2016-12-05 MED ORDER — DEXAMETHASONE SODIUM PHOSPHATE 10 MG/ML IJ SOLN
INTRAMUSCULAR | Status: DC | PRN
  Administered 2016-12-05: 10 mg via INTRAVENOUS

## 2016-12-05 MED ORDER — ROPIVACAINE HCL 5 MG/ML IJ SOLN
INTRAMUSCULAR | Status: DC | PRN
  Administered 2016-12-05: 30 mL via PERINEURAL

## 2016-12-05 MED ORDER — ROCURONIUM BROMIDE 100 MG/10ML IV SOLN
INTRAVENOUS | Status: DC | PRN
  Administered 2016-12-05: 50 mg via INTRAVENOUS

## 2016-12-05 MED ORDER — SODIUM CHLORIDE 0.9% FLUSH: 3.0000 mL | Freq: Two times a day (BID) | INTRAVENOUS | Status: DC

## 2016-12-05 MED ORDER — ACETAMINOPHEN 650 MG RE SUPP: 650.0000 mg | RECTAL | Status: DC | PRN

## 2016-12-05 MED ORDER — SODIUM CHLORIDE 0.9% FLUSH: 3.0000 mL | INTRAVENOUS | Status: DC | PRN

## 2016-12-05 MED ORDER — HEPARIN SODIUM (PORCINE) 5000 UNIT/ML IJ SOLN
5000.0000 [IU] | Freq: Once | INTRAMUSCULAR | Status: AC
  Administered 2016-12-05: 5000 [IU] via SUBCUTANEOUS
  Filled 2016-12-05: qty 1

## 2016-12-05 MED ORDER — LIDOCAINE HCL (CARDIAC) 20 MG/ML IV SOLN
INTRAVENOUS | Status: DC | PRN
  Administered 2016-12-05: 25 mg via INTRATRACHEAL
  Administered 2016-12-05: 75 mg via INTRAVENOUS

## 2016-12-05 MED ORDER — ACETAMINOPHEN 500 MG PO TABS
1000.0000 mg | ORAL_TABLET | ORAL | Status: AC
  Administered 2016-12-05: 1000 mg via ORAL
  Filled 2016-12-05: qty 2

## 2016-12-05 MED ORDER — CELECOXIB 200 MG PO CAPS
400.0000 mg | ORAL_CAPSULE | ORAL | Status: AC
  Administered 2016-12-05: 400 mg via ORAL
  Filled 2016-12-05: qty 2

## 2016-12-05 MED ORDER — ACETAMINOPHEN 325 MG PO TABS: 650.0000 mg | ORAL_TABLET | ORAL | Status: DC | PRN

## 2016-12-05 MED ORDER — SUFENTANIL CITRATE 50 MCG/ML IV SOLN
INTRAVENOUS | Status: DC | PRN
  Administered 2016-12-05 (×3): 10 ug via INTRAVENOUS

## 2016-12-05 MED ORDER — FENTANYL CITRATE (PF) 100 MCG/2ML IJ SOLN: INTRAMUSCULAR | Status: DC | PRN

## 2016-12-05 MED ORDER — EPHEDRINE SULFATE-NACL 50-0.9 MG/10ML-% IV SOSY
PREFILLED_SYRINGE | INTRAVENOUS | Status: DC | PRN
  Administered 2016-12-05 (×2): 5 mg via INTRAVENOUS

## 2016-12-05 MED ORDER — PHENYLEPHRINE 40 MCG/ML (10ML) SYRINGE FOR IV PUSH (FOR BLOOD PRESSURE SUPPORT)
PREFILLED_SYRINGE | INTRAVENOUS | Status: AC
  Filled 2016-12-05: qty 10

## 2016-12-05 MED ORDER — OXYCODONE HCL 5 MG PO TABS: 5.0000 mg | ORAL_TABLET | ORAL | Status: DC | PRN

## 2016-12-05 MED ORDER — FENTANYL CITRATE (PF) 100 MCG/2ML IJ SOLN
25.0000 ug | INTRAMUSCULAR | Status: DC | PRN
Start: 2016-12-05 — End: 2016-12-05

## 2016-12-05 MED ORDER — SUGAMMADEX SODIUM 200 MG/2ML IV SOLN
INTRAVENOUS | Status: DC | PRN
  Administered 2016-12-05: 200 mg via INTRAVENOUS

## 2016-12-05 MED ORDER — PROPOFOL 10 MG/ML IV BOLUS
INTRAVENOUS | Status: AC
  Filled 2016-12-05: qty 40

## 2016-12-05 MED ORDER — LACTATED RINGERS IV SOLN
INTRAVENOUS | Status: DC
Start: 2016-12-05 — End: 2016-12-05
  Administered 2016-12-05: 12:00:00 via INTRAVENOUS

## 2016-12-05 MED ORDER — DEXAMETHASONE SODIUM PHOSPHATE 10 MG/ML IJ SOLN
INTRAMUSCULAR | Status: AC
  Filled 2016-12-05: qty 1

## 2016-12-05 MED ORDER — MIDAZOLAM HCL 2 MG/2ML IJ SOLN
INTRAMUSCULAR | Status: AC
  Filled 2016-12-05: qty 2

## 2016-12-05 MED ORDER — ACETAMINOPHEN 650 MG RE SUPP
650.0000 mg | RECTAL | Status: DC | PRN
  Filled 2016-12-05: qty 1

## 2016-12-05 MED ORDER — SUFENTANIL CITRATE 50 MCG/ML IV SOLN
INTRAVENOUS | Status: AC
  Filled 2016-12-05: qty 1

## 2016-12-05 MED ORDER — ONDANSETRON HCL 4 MG/2ML IJ SOLN: 4.0000 mg | Freq: Once | INTRAMUSCULAR | Status: DC | PRN

## 2016-12-05 MED ORDER — PHENYLEPHRINE HCL 10 MG/ML IJ SOLN
INTRAMUSCULAR | Status: DC | PRN
  Administered 2016-12-05 (×2): 80 ug via INTRAVENOUS

## 2016-12-05 MED ORDER — ONDANSETRON HCL 4 MG/2ML IJ SOLN
INTRAMUSCULAR | Status: DC | PRN
  Administered 2016-12-05: 4 mg via INTRAVENOUS

## 2016-12-05 MED ORDER — PROPOFOL 10 MG/ML IV BOLUS
INTRAVENOUS | Status: DC | PRN
  Administered 2016-12-05: 200 mg via INTRAVENOUS

## 2016-12-05 SURGICAL SUPPLY — 31 items
BLADE SURG 15 STRL LF DISP TIS (BLADE) ×2 IMPLANT
BLADE SURG 15 STRL SS (BLADE) ×2
COVER SURGICAL LIGHT HANDLE (MISCELLANEOUS) ×4 IMPLANT
DECANTER SPIKE VIAL GLASS SM (MISCELLANEOUS) ×4 IMPLANT
DERMABOND ADVANCED (GAUZE/BANDAGES/DRESSINGS) ×2
DERMABOND ADVANCED .7 DNX12 (GAUZE/BANDAGES/DRESSINGS) ×2 IMPLANT
DISSECTOR ROUND CHERRY 3/8 STR (MISCELLANEOUS) IMPLANT
DRAIN PENROSE 18X1/2 LTX STRL (DRAIN) ×4 IMPLANT
DRAPE LAPAROTOMY TRNSV 102X78 (DRAPE) ×4 IMPLANT
ELECT PENCIL ROCKER SW 15FT (MISCELLANEOUS) ×4 IMPLANT
ELECT REM PT RETURN 15FT ADLT (MISCELLANEOUS) ×4 IMPLANT
GLOVE BIOGEL M 8.0 STRL (GLOVE) ×4 IMPLANT
GOWN STRL REUS W/TWL XL LVL3 (GOWN DISPOSABLE) ×8 IMPLANT
KIT BASIN OR (CUSTOM PROCEDURE TRAY) ×4 IMPLANT
MESH HERNIA 3X6 (Mesh General) ×4 IMPLANT
NEEDLE HYPO 22GX1.5 SAFETY (NEEDLE) ×4 IMPLANT
PACK BASIC VI WITH GOWN DISP (CUSTOM PROCEDURE TRAY) ×4 IMPLANT
SPONGE LAP 4X18 X RAY DECT (DISPOSABLE) ×4 IMPLANT
STAPLER VISISTAT 35W (STAPLE) IMPLANT
SUT MNCRL AB 4-0 PS2 18 (SUTURE) ×4 IMPLANT
SUT PROLENE 2 0 CT2 30 (SUTURE) ×12 IMPLANT
SUT SILK 2 0 SH (SUTURE) IMPLANT
SUT VIC AB 2-0 SH 27 (SUTURE) ×2
SUT VIC AB 2-0 SH 27X BRD (SUTURE) ×2 IMPLANT
SUT VIC AB 4-0 SH 18 (SUTURE) ×4 IMPLANT
SUT VICRYL 4-0 (SUTURE) ×4 IMPLANT
SYR 20CC LL (SYRINGE) ×4 IMPLANT
SYR BULB IRRIGATION 50ML (SYRINGE) ×4 IMPLANT
TOWEL OR 17X26 10 PK STRL BLUE (TOWEL DISPOSABLE) ×4 IMPLANT
TOWEL OR NON WOVEN STRL DISP B (DISPOSABLE) ×4 IMPLANT
YANKAUER SUCT BULB TIP 10FT TU (MISCELLANEOUS) ×4 IMPLANT

## 2016-12-05 NOTE — Anesthesia Procedure Notes (Signed)
Anesthesia Regional Block: TAP block   Pre-Anesthetic Checklist: ,, timeout performed, Correct Patient, Correct Site, Correct Laterality, Correct Procedure,, site marked, risks and benefits discussed, Surgical consent,  Pre-op evaluation,  At surgeon's request and post-op pain management  Laterality: Right  Prep: chloraprep       Needles:  Injection technique: Single-shot  Needle Type: Echogenic Stimulator Needle     Needle Length: 10cm  Needle Gauge: 21     Additional Needles:   Procedures: ultrasound guided,,,,,,,,  Narrative:  Start time: 12/05/2016 12:40 PM End time: 12/05/2016 12:50 PM Injection made incrementally with aspirations every 5 mL.  Performed by: Personally  Anesthesiologist: Karna ChristmasELLENDER, RYAN P  Additional Notes: Functioning IV was confirmed and monitors were applied.  A 100mm 21ga Pajunk echogenic stimulator needle was used. Sterile prep, hand hygiene and sterile gloves were used.  Negative aspiration and negative test dose prior to incremental administration of local anesthetic. The patient tolerated the procedure well.

## 2016-12-05 NOTE — Brief Op Note (Signed)
12/05/2016  2:23 PM  PATIENT:  Nathan Atkins  51 y.o. male  PRE-OPERATIVE DIAGNOSIS:  Right Inguinal Hernia  POST-OPERATIVE DIAGNOSIS:  Right Inguinal Hernia  PROCEDURE:  Procedure(s): OPEN RIGHT INGUINAL HERNIA REPAIR WITH MESH (Right) INSERTION OF MESH (Right)  SURGEON:  Surgeon(s) and Role:    Luretha Murphy* Paysley Poplar, MD - Primary  PHYSICIAN ASSISTANT:   ASSISTANTS: none   ANESTHESIA:   general  EBL:  Total I/O In: 1000 [I.V.:1000] Out: -   BLOOD ADMINISTERED:none  DRAINS: none   LOCAL MEDICATIONS USED:  BUPIVICAINE   SPECIMEN:  No Specimen  DISPOSITION OF SPECIMEN:  N/A  COUNTS:  YES  TOURNIQUET:  * No tourniquets in log *  DICTATION: .Other Dictation: Dictation Number [id number]  PLAN OF CARE: Discharge to home after PACU  PATIENT DISPOSITION:  PACU - hemodynamically stable.   Delay start of Pharmacological VTE agent (>24hrs) due to surgical blood loss or risk of bleeding: no

## 2016-12-05 NOTE — Interval H&P Note (Signed)
History and Physical Interval Note:  12/05/2016 12:18 PM  Nathan Atkins Mentzel  has presented today for surgery, with the diagnosis of Right Inguinal Hernia  The various methods of treatment have been discussed with the patient and family. After consideration of risks, benefits and other options for treatment, the patient has consented to  Procedure(s): OPEN RIGHT INGUINAL HERNIA REPAIR WITH MESH (Right) INSERTION OF MESH (Right) as a surgical intervention .  The patient's history has been reviewed, patient examined, no change in status, stable for surgery.  I have reviewed the patient's chart and labs.  Questions were answered to the patient's satisfaction.     Lathon Adan B

## 2016-12-05 NOTE — Discharge Instructions (Signed)
Inguinal Hernia, Adult An inguinal hernia is when fat or the intestines push through the area where the leg meets the lower belly (groin) and make a rounded lump (bulge). This condition happens over time. There are three types of inguinal hernias. These types include:  Hernias that can be pushed back into the belly (are reducible).  Hernias that cannot be pushed back into the belly (are incarcerated).  Hernias that cannot be pushed back into the belly and lose their blood supply (get strangulated). This type needs emergency surgery.  Follow these instructions at home: Lifestyle  Drink enough fluid to keep your urine (pee) clear or pale yellow.  Eat plenty of fruits, vegetables, and whole grains. These have a lot of fiber. Talk with your doctor if you have questions.  Avoid lifting heavy objects.  Avoid standing for long periods of time.  Do not use tobacco products. These include cigarettes, chewing tobacco, or e-cigarettes. If you need help quitting, ask your doctor.  Try to stay at a healthy weight. General instructions  Do not try to force the hernia back in.  Watch your hernia for any changes in color or size. Let your doctor know if there are any changes.  Take over-the-counter and prescription medicines only as told by your doctor.  Keep all follow-up visits as told by your doctor. This is important. Contact a doctor if:  You have a fever.  You have new symptoms.  Your symptoms get worse. Get help right away if:  The area where the legs meets the lower belly has: ? Pain that gets worse suddenly. ? A bulge that gets bigger suddenly and does not go down. ? A bulge that turns red or purple. ? A bulge that is painful to the touch.  You are a man and your scrotum: ? Suddenly feels painful. ? Suddenly changes in size.  You feel sick to your stomach (nauseous) and this feeling does not go away.  You throw up (vomit) and this keeps happening.  You feel your heart  beating a lot more quickly than normal.  You cannot poop (have a bowel movement) or pass gas. This information is not intended to replace advice given to you by your health care provider. Make sure you discuss any questions you have with your health care provider. Document Released: 05/15/2006 Document Revised: 09/20/2015 Document Reviewed: 02/22/2014 Elsevier Interactive Patient Education  2018 [employer]. General Anesthesia, Adult, Care After These instructions provide you with information about caring for yourself after your procedure. Your health care provider may also give you more specific instructions. Your treatment has been planned according to current medical practices, but problems sometimes occur. Call your health care provider if you have any problems or questions after your procedure. What can I expect after the procedure? After the procedure, it is common to have:  Vomiting.  A sore throat.  Mental slowness.  It is common to feel:  Nauseous.  Cold or shivery.  Sleepy.  Tired.  Sore or achy, even in parts of your body where you did not have surgery.  Follow these instructions at home: For at least 24 hours after the procedure:  Do not: ? Participate in activities where you could fall or become injured. ? Drive. ? Use heavy machinery. ? Drink alcohol. ? Take sleeping pills or medicines that cause drowsiness. ? Make important decisions or sign legal documents. ? Take care of children on your own.  Rest. Eating and drinking  If you vomit, drink  water, juice, or soup when you can drink without vomiting.  Drink enough fluid to keep your urine clear or pale yellow.  Make sure you have little or no nausea before eating solid foods.  Follow the diet recommended by your health care provider. General instructions  Have a responsible adult stay with you until you are awake and alert.  Return to your normal activities as told by your health care provider.  Ask your health care provider what activities are safe for you.  Take over-the-counter and prescription medicines only as told by your health care provider.  If you smoke, do not smoke without supervision.  Keep all follow-up visits as told by your health care provider. This is important. Contact a health care provider if:  You continue to have nausea or vomiting at home, and medicines are not helpful.  You cannot drink fluids or start eating again.  You cannot urinate after 8-12 hours.  You develop a skin rash.  You have fever.  You have increasing redness at the site of your procedure. Get help right away if:  You have difficulty breathing.  You have chest pain.  You have unexpected bleeding.  You feel that you are having a life-threatening or urgent problem. This information is not intended to replace advice given to you by your health care provider. Make sure you discuss any questions you have with your health care provider. Document Released: 07/21/2000 Document Revised: 09/17/2015 Document Reviewed: 03/29/2015 Elsevier Interactive Patient Education  [employer]2018 Elsevier Inc.

## 2016-12-05 NOTE — Anesthesia Preprocedure Evaluation (Addendum)
Anesthesia Evaluation  Patient identified by MRN, date of birth, ID band Patient awake    Reviewed: Allergy & Precautions, NPO status , Patient's Chart, lab work & pertinent test results  Airway Mallampati: III  TM Distance: >3 FB Neck ROM: Full    Dental no notable dental hx.    Pulmonary former smoker,    Pulmonary exam normal breath sounds clear to auscultation       Cardiovascular Exercise Tolerance: Good Normal cardiovascular exam Rhythm:Regular Rate:Normal  ECG: SR, rate 70  ECHO: LV EF: 65% -   70%  Sees cardiologist   Neuro/Psych negative neurological ROS  negative psych ROS   GI/Hepatic negative GI ROS, Neg liver ROS,   Endo/Other  negative endocrine ROS  Renal/GU negative Renal ROS     Musculoskeletal negative musculoskeletal ROS (+)   Abdominal   Peds  Hematology negative hematology ROS (+)   Anesthesia Other Findings   Reproductive/Obstetrics                            Anesthesia Physical Anesthesia Plan  ASA: II  Anesthesia Plan: General and Regional   Post-op Pain Management: GA combined w/ Regional for post-op pain   Induction: Intravenous  PONV Risk Score and Plan: 2 and Ondansetron, Dexamethasone and Midazolam  Airway Management Planned: Oral ETT  Additional Equipment:   Intra-op Plan:   Post-operative Plan: Extubation in OR  Informed Consent: I have reviewed the patients History and Physical, chart, labs and discussed the procedure including the risks, benefits and alternatives for the proposed anesthesia with the patient or authorized representative who has indicated his/her understanding and acceptance.   Dental advisory given  Plan Discussed with: CRNA  Anesthesia Plan Comments:       Anesthesia Quick Evaluation

## 2016-12-05 NOTE — Anesthesia Procedure Notes (Signed)
Procedure Name: Intubation Date/Time: 12/05/2016 1:09 PM Performed by: Nathan Atkins Pre-anesthesia Checklist: Patient identified, Emergency Drugs available, Suction available and Patient being monitored Patient Re-evaluated:Patient Re-evaluated prior to induction Oxygen Delivery Method: Circle system utilized Preoxygenation: Pre-oxygenation with 100% oxygen Induction Type: IV induction Ventilation: Mask ventilation without difficulty Laryngoscope Size: Mac and 4 Grade View: Grade III Tube type: Oral Tube size: 8.0 mm Number of attempts: 1 Airway Equipment and Method: Stylet,  Oral airway and Video-laryngoscopy Placement Confirmation: ETT inserted through vocal cords under direct vision,  positive ETCO2 and breath sounds checked- equal and bilateral Secured at: 23 cm Tube secured with: Tape Dental Injury: Teeth and Oropharynx as per pre-operative assessment

## 2016-12-05 NOTE — Op Note (Signed)
[name], Nathan            ACCOUNT NO.:  [id number]  MEDICAL RECORD NO.:  [id number]  LOCATION:  [facility]                         [facility]  PHYSICIAN:  Thornton ParkMatthew B. Daphine DeutscherMartin, MD  DATE OF BIRTH:  03-Nov-1965  DATE OF PROCEDURE:  12/05/2016 DATE OF DISCHARGE:                              OPERATIVE REPORT   PREOPERATIVE DIAGNOSIS:  Right inguinal hernia.  POSTOPERATIVE DIAGNOSIS:  Right indirect inguinal hernia.  PROCEDURE:  Open right inguinal herniorrhaphy with Marlex type mesh.  SURGEON:  Thornton ParkMatthew B. Daphine DeutscherMartin, MD.  ASSISTANHuntley Dec:  Laila Simpson.  ANESTHESIA:  General endotracheal.  DESCRIPTION OF PROCEDURE:  This 51 year old male was taken to room 1 and given general anesthesia.  He had a TAP block.  Abdomen was prepped and draped sterilely.  Previously, he had been clipped.  A small oblique incision was made and carried down through the subcutaneous tissue to the external oblique.  The fibers were opened along the direction of the fibers to the external ring, which was open.  Cord was mobilized and dissected free from a prominent direct inguinal hernia.  Cord structures were inspected and remained intact and there was no indirect hernia noted.  Cremasteric fibers were divided partially proximally to help free the floor.  The direct sac was imbricated with a running 2-0 Prolene.  A piece of Marlex type mesh was then cut to fit and sutured along the inguinal ligament with a running 2-0 Prolene.  It was carried medially to the internal oblique with a running 2-0 Prolene and sutured around the cord structures with a single horizontal mattress suture. This was tucked beneath the external oblique.  The external oblique was then closed with running 2-0 Vicryl.  The area was injected with Exparel and closed with a running 2-0 Vicryl external oblique and 4-0 Vicryl in the subcutaneous tissue and then a running subcuticular 4-0 Monocryl and Dermabond.  The patient seemed to tolerate  the procedure well, was taken to the recovery room in satisfactory condition.     Thornton ParkMatthew B. Daphine DeutscherMartin, MD     MBM/MEDQ  D:  12/05/2016  T:  12/05/2016  Job:  [id number]  cc:   Windy Fastonald L. Earlene Plateravis, M.D. Fax: [phone]

## 2016-12-05 NOTE — Transfer of Care (Signed)
Immediate Anesthesia Transfer of Care Note  Patient: Nathan Atkins  Procedure(s) Performed: Procedure(s): OPEN RIGHT INGUINAL HERNIA REPAIR WITH MESH (Right) INSERTION OF MESH (Right)  Patient Location: PACU  Anesthesia Type:General  Level of Consciousness: awake, alert , oriented and patient cooperative  Airway & Oxygen Therapy: Patient Spontanous Breathing and Patient connected to face mask oxygen  Post-op Assessment: Report given to RN, Post -op Vital signs reviewed and stable and Patient moving all extremities X 4  Post vital signs: stable  Last Vitals:  Vitals:   12/05/16 1252 12/05/16 1253  BP:    Pulse: 83 72  Resp: 16 13  Temp:    SpO2: 100% 100%    Last Pain:  Vitals:   12/05/16 1135  TempSrc: Oral      Patients Stated Pain Goal: 4 (12/05/16 1128)  Complications: No apparent anesthesia complications

## 2016-12-05 NOTE — Anesthesia Postprocedure Evaluation (Signed)
Anesthesia Post Note  Patient: Pension scheme managerDwight Atkins  Procedure(s) Performed: Procedure(s) (LRB): OPEN RIGHT INGUINAL HERNIA REPAIR WITH MESH (Right) INSERTION OF MESH (Right)     Patient location during evaluation: PACU Anesthesia Type: Regional and General Level of consciousness: awake and alert Pain management: pain level controlled Vital Signs Assessment: post-procedure vital signs reviewed and stable Respiratory status: spontaneous breathing, nonlabored ventilation, respiratory function stable and patient connected to nasal cannula oxygen Cardiovascular status: blood pressure returned to baseline and stable Postop Assessment: no signs of nausea or vomiting Anesthetic complications: no    Last Vitals:  Vitals:   12/05/16 1515 12/05/16 1525  BP: 121/74 123/68  Pulse: 78 78  Resp: 10 18  Temp: 36.7 C 36.7 C  SpO2: 98% 98%    Last Pain:  Vitals:   12/05/16 1525  TempSrc:   PainSc: 1                  [name]

## 2017-02-13 ENCOUNTER — Telehealth: Payer: Self-pay | Admitting: General Practice

## 2017-02-13 IMAGING — CR DG CHEST 2V
2 series · 2 of 2 positions shown · non-contrast
Comparison: None.

CLINICAL DATA: Chest pain, arrhythmia.

EXAM:
CHEST  2 VIEW

[chest pa]
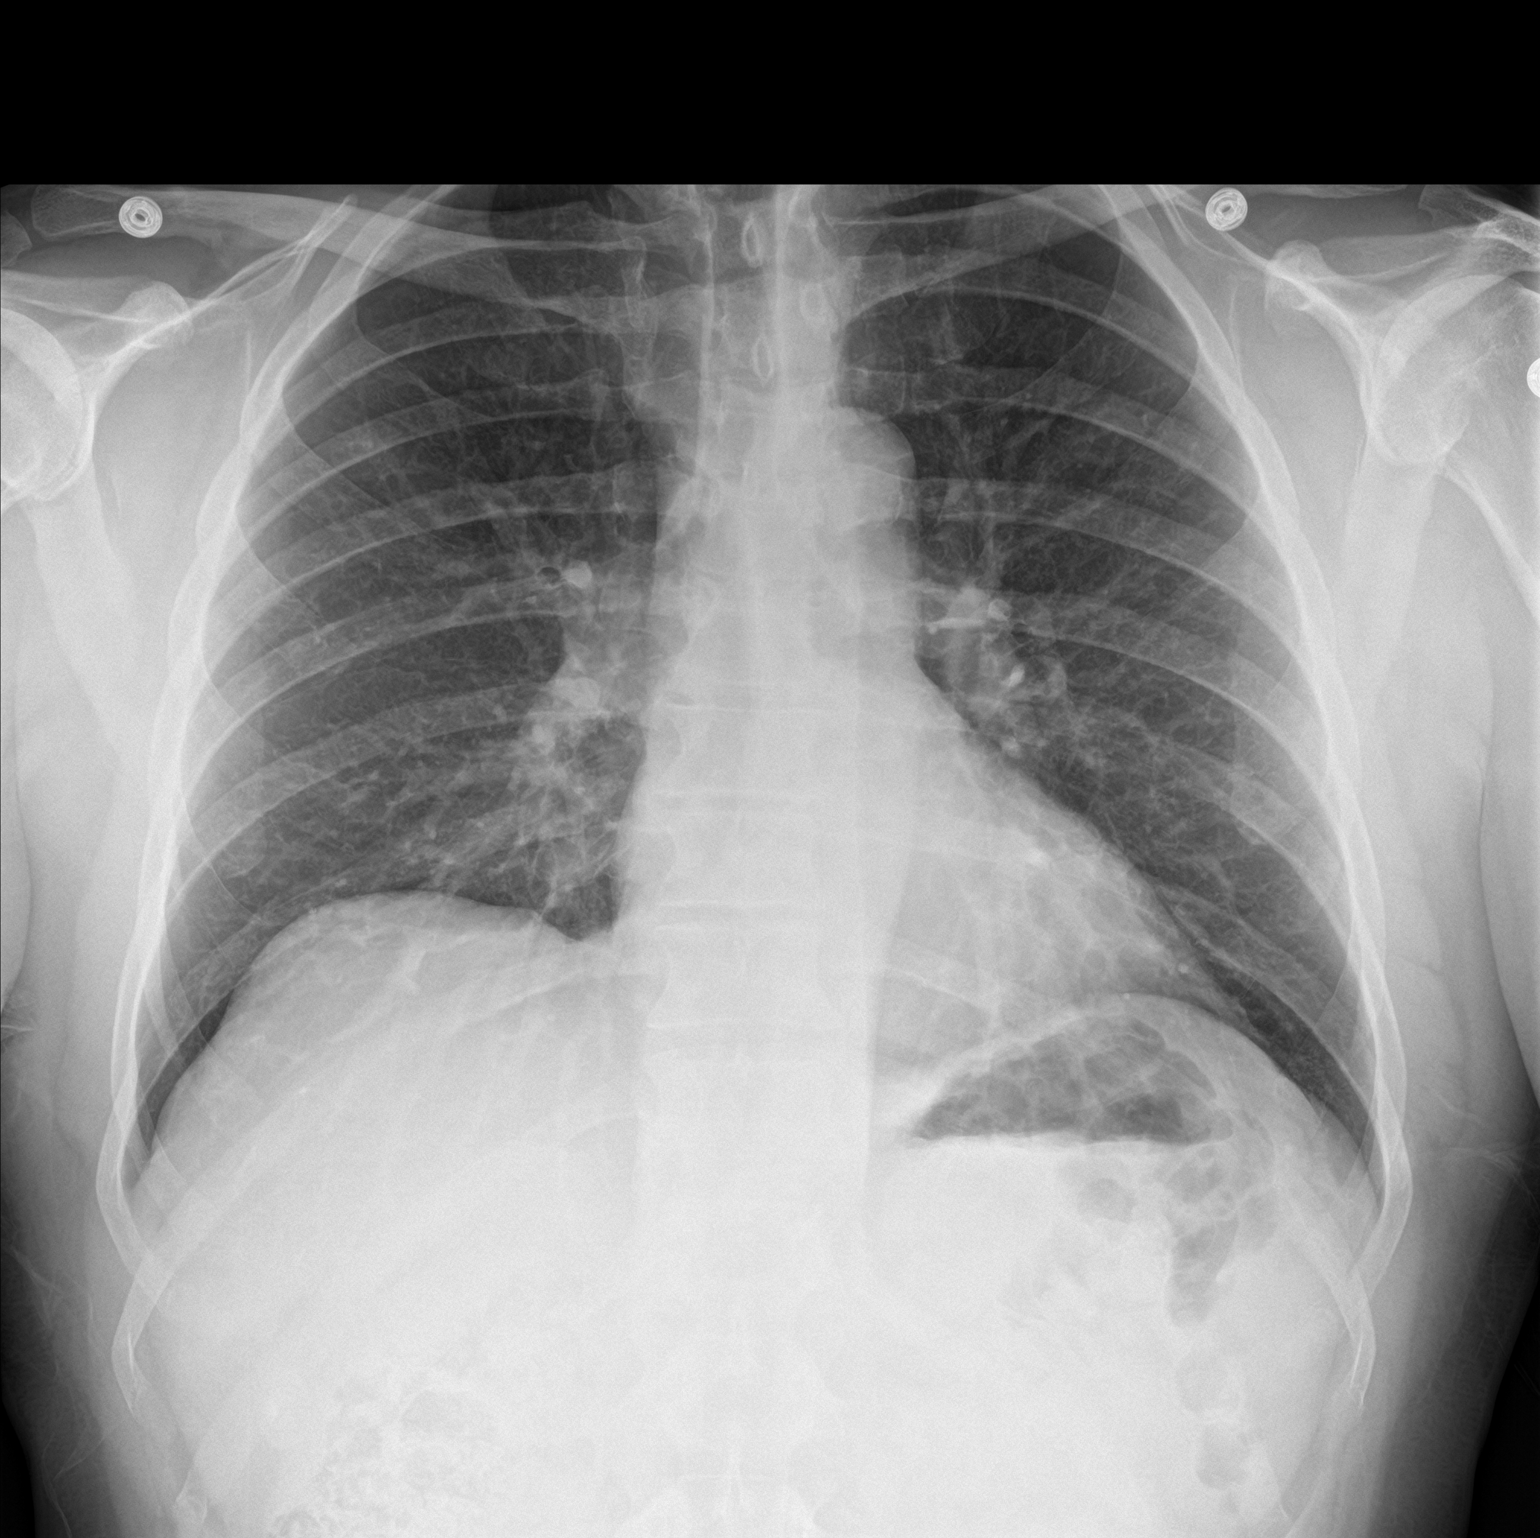

[chest lat]
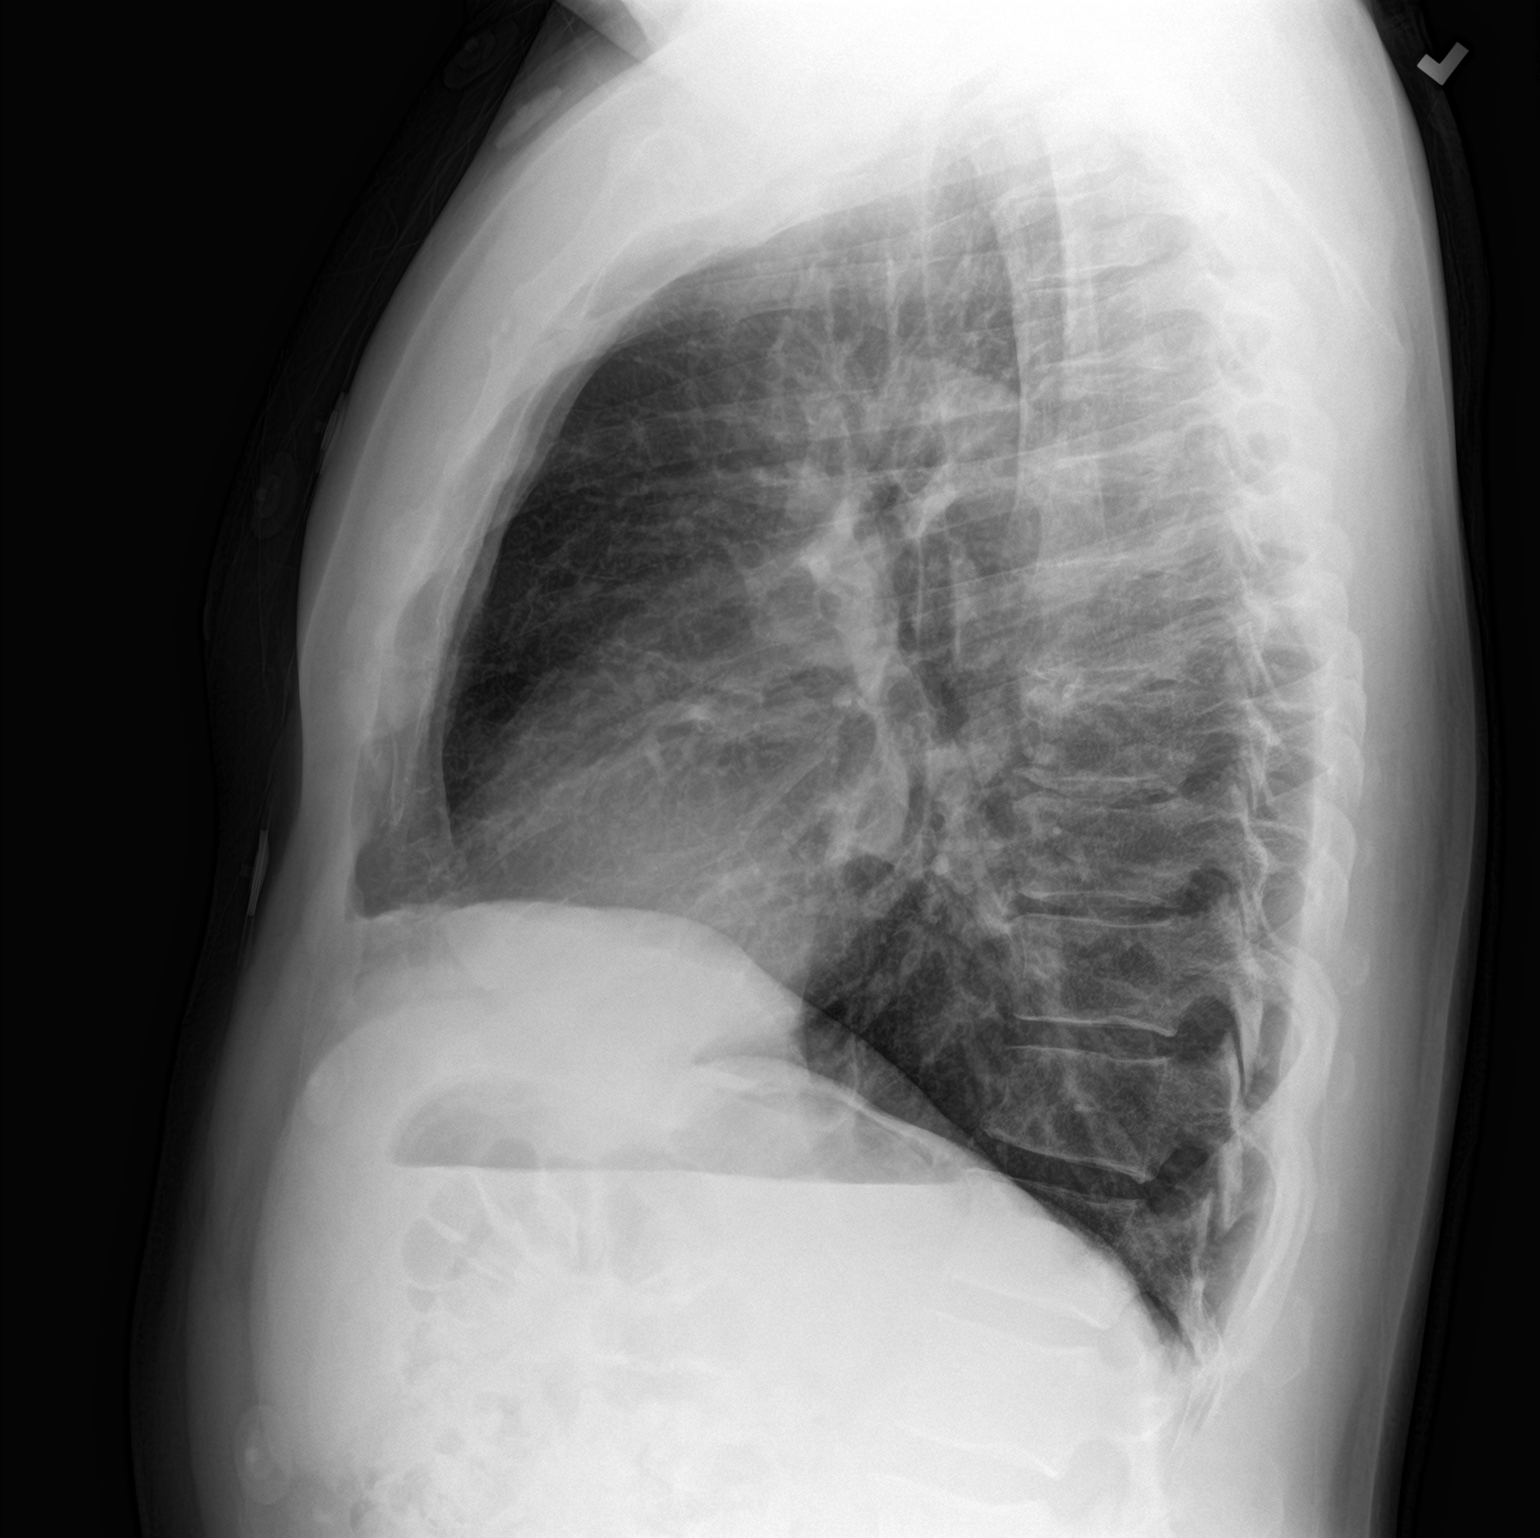

[2 of 2 positions shown; findings below may reference images not displayed]

FINDINGS: Heart size is normal. Overall cardiomediastinal silhouette is normal
in size and configuration. Lungs are clear. Lung volumes are normal.
No pleural effusion. No pneumothorax seen.

There is slight elevation the right hemidiaphragm. Osseous and soft
tissue structures about the chest are otherwise unremarkable.
IMPRESSION: Lungs are clear and there is no evidence of acute cardiopulmonary
abnormality.

## 2017-02-13 NOTE — Telephone Encounter (Signed)
Received referral from [facility] to set patient up with PCP.  I have left vm for patient to call back to schedule.

## 2017-02-18 NOTE — Telephone Encounter (Signed)
Left additional vm

## 2017-04-22 NOTE — Telephone Encounter (Signed)
Left third vm.  Faxed [facility] back in regard.

## 2019-07-09 ENCOUNTER — Ambulatory Visit: Payer: Self-pay | Attending: Internal Medicine

## 2019-07-09 DIAGNOSIS — Z23 Encounter for immunization: Secondary | ICD-10-CM

## 2019-07-09 NOTE — Progress Notes (Signed)
   Covid-19 Vaccination Clinic  Name:  Toa Mia    MRN: [id number] DOB: [birth year]  07/09/2019  Mr. Meyerhoff was observed post Covid-19 immunization for 15 minutes without incident. He was provided with Vaccine Information Sheet and instruction to access the V-Safe system.   Mr. Membreno was instructed to call 911 with any severe reactions post vaccine: Marland Kitchen Difficulty breathing  . Swelling of face and throat  . A fast heartbeat  . A bad rash all over body  . Dizziness and weakness   Immunizations Administered    Name Date Dose VIS Date Route   Pfizer COVID-19 Vaccine 07/09/2019  1:55 PM 0.3 mL 04/08/2019 Intramuscular   Manufacturer: [employer], [employer]   Lot: [id number]   NDC: [address]-1

## 2019-08-02 ENCOUNTER — Other Ambulatory Visit: Payer: Self-pay

## 2019-08-02 ENCOUNTER — Ambulatory Visit: Payer: Self-pay | Attending: Internal Medicine

## 2019-08-02 DIAGNOSIS — Z23 Encounter for immunization: Secondary | ICD-10-CM

## 2019-08-02 NOTE — Progress Notes (Signed)
   Covid-19 Vaccination Clinic  Name:  Nathan Atkins    MRN: [id number] DOB: [birth year]  08/02/2019  Nathan Atkins was observed post Covid-19 immunization for 15 minutes without incident. He was provided with Vaccine Information Sheet and instruction to access the V-Safe system.   Nathan Atkins was instructed to call 911 with any severe reactions post vaccine: Marland Kitchen Difficulty breathing  . Swelling of face and throat  . A fast heartbeat  . A bad rash all over body  . Dizziness and weakness   Immunizations Administered    Name Date Dose VIS Date Route   Pfizer COVID-19 Vaccine 08/02/2019  3:00 PM 0.3 mL 04/08/2019 Intramuscular   Manufacturer: [employer], [employer]   Lot: [id number]   NDC: [address]-7

## 2020-10-11 ENCOUNTER — Encounter: Payer: Self-pay | Admitting: Gastroenterology

## 2020-11-08 ENCOUNTER — Telehealth: Payer: Self-pay | Admitting: *Deleted

## 2020-11-08 NOTE — Telephone Encounter (Signed)
Nathan Atkins,  Can you review this 2018 Airway note and advise thx  Lelan Pons   Procedure Name: Intubation Date/Time: 12/05/2016 1:09 PM Performed by: Lissa Morales Pre-anesthesia Checklist: Patient identified, Emergency Drugs available, Suction available and Patient being monitored Patient Re-evaluated:Patient Re-evaluated prior to induction Oxygen Delivery Method: Circle system utilized Preoxygenation: Pre-oxygenation with 100% oxygen Induction Type: IV induction Ventilation: Mask ventilation without difficulty Laryngoscope Size: Mac and 4 Grade View: Grade III Tube type: Oral Tube size: 8.0 mm Number of attempts: 1 Airway Equipment and Method: Stylet,  Oral airway and Video-laryngoscopy Placement Confirmation: ETT inserted through vocal cords under direct vision,  positive ETCO2 and breath sounds checked- equal and bilateral Secured at: 23 cm Tube secured with: Tape Dental Injury: Teeth and Oropharynx as per pre-operative assessment

## 2020-11-09 ENCOUNTER — Encounter: Payer: Self-pay | Admitting: *Deleted

## 2020-11-09 DIAGNOSIS — T884XXA Failed or difficult intubation, initial encounter: Secondary | ICD-10-CM | POA: Insufficient documentation

## 2020-11-09 NOTE — Telephone Encounter (Signed)
Dr Katherine Roan,  Pt has a difficult intubation hx- per Jonny Ruiz, case needs to be at [facility]-  do you want an OV or direct at [facility]?  Please advise, Thanks Hilda Lias PV

## 2020-11-09 NOTE — Telephone Encounter (Signed)
Agree, with need for Hospital-based procedure. OK for patient to be set up as a direct colonoscopy. If patient wants to be seen in clinic then I am OK with that. Please let me know what he decides. Thanks. GM

## 2020-11-09 NOTE — Telephone Encounter (Signed)
Attempted Pt-  spoke with daughter- LM to have pt  call me back  Monday about intubation

## 2020-11-12 NOTE — Telephone Encounter (Signed)
Spoke with pt- explained the difficult intubation and need for [facility] case-  Pt states he does not want to go to hospital for colon- he will think about this and let us know- pt cannot have [facility] colon, will need colon at [facility] in the future - pt aware- will CB when ready to discuss -  Nathan Atkins

## 2020-12-07 ENCOUNTER — Encounter: Payer: Self-pay | Admitting: Gastroenterology
